# Patient Record
Sex: Male | Born: 1965
Health system: Southern US, Community
[De-identification: ages and names within clinical notes are randomized; demographics above are authoritative.]

## PROBLEM LIST (undated history)

## (undated) MED FILL — Irinotecan HCl Inj 100 MG/5ML (20 MG/ML): INTRAVENOUS | Qty: 14 | Status: AC

## (undated) MED FILL — Dexamethasone Sodium Phosphate Inj 100 MG/10ML: INTRAMUSCULAR | Qty: 1 | Status: AC

---

## 2020-06-03 DIAGNOSIS — R569 Unspecified convulsions: Secondary | ICD-10-CM | POA: Insufficient documentation

## 2020-06-03 DIAGNOSIS — C711 Malignant neoplasm of frontal lobe: Secondary | ICD-10-CM | POA: Insufficient documentation

## 2020-06-07 DIAGNOSIS — R569 Unspecified convulsions: Secondary | ICD-10-CM

## 2020-06-15 DIAGNOSIS — C711 Malignant neoplasm of frontal lobe: Secondary | ICD-10-CM | POA: Insufficient documentation

## 2020-08-16 ENCOUNTER — Other Ambulatory Visit: Payer: Self-pay | Admitting: Oncology

## 2020-08-16 DIAGNOSIS — C711 Malignant neoplasm of frontal lobe: Secondary | ICD-10-CM

## 2020-08-17 ENCOUNTER — Telehealth: Payer: Self-pay | Admitting: Oncology

## 2020-08-17 ENCOUNTER — Encounter: Payer: Self-pay | Admitting: Oncology

## 2020-08-17 ENCOUNTER — Inpatient Hospital Stay: Payer: Self-pay | Attending: Oncology

## 2020-08-17 ENCOUNTER — Other Ambulatory Visit: Payer: Self-pay | Admitting: Hematology and Oncology

## 2020-08-17 ENCOUNTER — Inpatient Hospital Stay (INDEPENDENT_AMBULATORY_CARE_PROVIDER_SITE_OTHER): Payer: Medicaid Other | Admitting: Oncology

## 2020-08-17 DIAGNOSIS — C711 Malignant neoplasm of frontal lobe: Secondary | ICD-10-CM

## 2020-08-17 DIAGNOSIS — R2689 Other abnormalities of gait and mobility: Secondary | ICD-10-CM | POA: Diagnosis not present

## 2020-08-17 DIAGNOSIS — R569 Unspecified convulsions: Secondary | ICD-10-CM

## 2020-08-17 DIAGNOSIS — G4089 Other seizures: Secondary | ICD-10-CM

## 2020-08-17 DIAGNOSIS — Z803 Family history of malignant neoplasm of breast: Secondary | ICD-10-CM

## 2020-08-17 DIAGNOSIS — Z79899 Other long term (current) drug therapy: Secondary | ICD-10-CM

## 2020-08-17 DIAGNOSIS — R531 Weakness: Secondary | ICD-10-CM

## 2020-08-17 DIAGNOSIS — R000 Tachycardia, unspecified: Secondary | ICD-10-CM

## 2020-08-17 LAB — HEPATIC FUNCTION PANEL
ALT: 15 (ref 10–40)
AST: 18 (ref 14–40)
Alkaline Phosphatase: 66 (ref 25–125)
Bilirubin, Total: 0.4

## 2020-08-17 LAB — BASIC METABOLIC PANEL
BUN: 16 (ref 4–21)
CO2: 28 — AB (ref 13–22)
Chloride: 107 (ref 99–108)
Creatinine: 0.8 (ref 0.6–1.3)
Glucose: 84
Potassium: 4.4 (ref 3.4–5.3)
Sodium: 142 (ref 137–147)

## 2020-08-17 LAB — CBC AND DIFFERENTIAL
HCT: 46 (ref 41–53)
Hemoglobin: 15.6 (ref 13.5–17.5)
Neutrophils Absolute: 5.07
Platelets: 220 (ref 150–399)
WBC: 8.6

## 2020-08-17 LAB — COMPREHENSIVE METABOLIC PANEL
Albumin: 4.2 (ref 3.5–5.0)
Calcium: 9.8 (ref 8.7–10.7)

## 2020-08-17 LAB — CBC: RBC: 4.72 (ref 3.87–5.11)

## 2020-08-17 NOTE — Telephone Encounter (Signed)
Patient referred by Sande Brothers, PA-C for Frontal Gliobastoma.  Appt made for 08/17/2020 Labs 3:45 pm - Consult 4:15 pm  Patient has Records in [place]

## 2020-08-17 NOTE — Progress Notes (Signed)
[facility]  [address] [place],  [place]  [address] [phone]  Clinic Day:  08/17/2020  Referring physician:  Sande Brothers, PA-C  CHIEF COMPLAINT:  CC: Left frontal glioblastoma  Current Treatment:  Will plan for Temodar 5 days per month   HISTORY OF PRESENT ILLNESS:  Leonard [name] is a 55 y.o. male referred by Sande Brothers, PA-C for the evaluation and treatment of frontal glioblastoma.  This began back in late October when the patient began to experience acute onset intractable right lower extremity motor partial seizures for approximately 2 weeks.  MRI brain from November revealed an enhancing mass along the paramedian posterior left frontal lobe measuring 1.5 x 1.1 x 1.3 cm and appeared to correlate to the mass seen on the comparison CT head from October.  An additional region of blood products and enhancement just posterior to this mass was also seen, measuring 1.2 x 0.9 x 0.9 cm.  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  While hospitalized at [facility] in [place] he started radiation with concurrent Temodar on December 2nd.  He was tapered off the dexamethasone during that time.  He completed 15 fractions of radiation therapy on December 21st and was released on December 23rd.   INTERVAL HISTORY:  Leonard [name] is currently on Vimpat, Depakote and Keppra for his seizures.  He was unable to fill the Klonopin.  He thinks he may be scheduled with his surgeon again on January 27th, but did not know the name or any details.  He still has weakness of the right lower extremity and uses a walker to ambulate.  Blood counts and chemistries are unremarkable.  His  appetite is good, and he is eating well.  He feels he may have lost some weight.  He denies fever, chills or other signs of  infection.  He denies nausea, vomiting, bowel issues, or abdominal pain.  He denies sore throat, cough, dyspnea, or chest pain.  REVIEW OF SYSTEMS:  Review of Systems  Constitutional: Negative.   HENT:  Negative.   Eyes: Negative.   Respiratory: Negative.   Cardiovascular: Negative.  Negative for leg swelling.  Gastrointestinal: Negative.   Endocrine: Negative.   Genitourinary: Negative.    Musculoskeletal: Positive for gait problem (uses a walker to ambulate).  Skin: Negative.   Neurological: Positive for extremity weakness (right), gait problem (uses a walker to ambulate) and seizures (partial, of the lower right extremity, on medication). Negative for dizziness, headaches and light-headedness.  Hematological: Negative.   Psychiatric/Behavioral: Negative.   All other systems reviewed and are negative.    VITALS:  Blood pressure (!) 150/97, pulse (!) 105, temperature 97.9 F (36.6 C), temperature source Oral, resp. rate 18, height 6\' 4"  (1.93 m), weight 158 lb 3.2 oz (71.8 kg), SpO2 97 %.  Wt Readings from Last 3 Encounters:  08/17/20 158 lb 3.2 oz (71.8 kg)    Body mass index is 19.26 kg/m.  Performance status (ECOG): 1 - Symptomatic but completely ambulatory  PHYSICAL EXAM:  Physical Exam Constitutional:      General: He is not in acute distress.    Appearance: Normal appearance. He is normal weight.  HENT:     Head: Normocephalic and atraumatic.  Eyes:     General: No scleral icterus.    Extraocular Movements: Extraocular movements intact.  Conjunctiva/sclera: Conjunctivae normal.     Pupils: Pupils are equal, round, and reactive to light.  Cardiovascular:     Rate and Rhythm: Regular rhythm. Tachycardia present.     Pulses: Normal pulses.     Heart sounds: Normal heart sounds. No murmur heard. No friction rub. No gallop.   Pulmonary:     Effort: Pulmonary effort is normal. No respiratory distress.     Breath sounds: Normal breath sounds.  Abdominal:      General: Bowel sounds are normal. There is no distension.     Palpations: Abdomen is soft. There is no mass.     Tenderness: There is no abdominal tenderness.  Musculoskeletal:        General: Normal range of motion.     Cervical back: Normal range of motion and neck supple.     Right lower leg: No edema.     Left lower leg: No edema.  Lymphadenopathy:     Cervical: No cervical adenopathy.  Skin:    General: Skin is warm and dry.     Comments: He has a scar about 6-7 cm on the vertex of his scalp.  He has a raised birthmark in the left posterior scalp.  Neurological:     General: No focal deficit present.     Mental Status: He is alert and oriented to person, place, and time. Mental status is at baseline.     Cranial Nerves: Cranial nerves are intact.     Motor: Weakness (right lower extremity, 1/5 strength) present.  Psychiatric:        Mood and Affect: Mood normal.        Behavior: Behavior normal.        Thought Content: Thought content normal.        Judgment: Judgment normal.     LABS:   CBC Latest Ref Rng & Units 08/17/2020  WBC - 8.6  Hemoglobin 13.5 - 17.5 15.6  Hematocrit 41 - 53 46  Platelets 150 - 399 220   CMP Latest Ref Rng & Units 08/17/2020  BUN 4 - 21 16  Creatinine 0.6 - 1.3 0.8  Sodium 137 - 147 142  Potassium 3.4 - 5.3 4.4  Chloride 99 - 108 107  CO2 13 - 22 28(A)  Calcium 8.7 - 10.7 9.8  Alkaline Phos 25 - 125 66  AST 14 - 40 18  ALT 10 - 40 15    STUDIES:  No results found.    HISTORY:  No past medical history on file. Seizures  Family History  Problem Relation Age of Onset  . Breast cancer Sister     Social History:  reports that he has never smoked. He has never used smokeless tobacco. He reports that he does not drink alcohol and does not use drugs.The patient is alone today.  He is single and lives at home with 2 of his brothers at this time.  He worked at [employer].    Allergies: No Known Allergies  Current Medications: Current  Outpatient Medications  Medication Sig Dispense Refill  . divalproex (DEPAKOTE) 500 MG DR tablet Take by mouth.    . levOCARNitine (CARNITOR) 330 MG tablet Take by mouth.    . levETIRAcetam (KEPPRA) 750 MG tablet Take 1,500 mg by mouth 2 (two) times daily.    Marland Kitchen VIMPAT 200 MG TABS tablet Take 200 mg by mouth 2 (two) times daily.     No current facility-administered medications for this visit.  ASSESSMENT & PLAN:   Assessment:  1.  Grade 4 left frontal glioblastoma.  We know he had a resection but I need the details on whether it was completely removed and whether any other device was used inter-operatively.  He started radiation with concurrent temozolomide on December 2nd, and completed 15 fractions of radiation therapy on December 21st.  We usually continue radiation for a longer period of time and I will need to discuss this with our radiation oncologist and tumor board.  I recommend that he continue Temodar for 5 days each month.  2.  Persistent right lower extremity weakness.  He has been using a walker to ambulate and working with physical therapy.  3.  Right lower extremity motor partial seizures.  He is currently on Vimpat, Depakote and Keppra.    Plan: This is a pleasant 55 year old male diagnosed with grade 4 glioblastoma of the left frontal lobe.  He underwent craniotomy on November 9th and again on November 11th due to residual tumor.  I am unsure if there was still residual disease after the second surgery, and we do not have the full records from [place] and we will obtain those records for full review.  While hospitalized he was placed on radiation with concurrent Temodar, and he completed 15 fractions for a total of 4,005 cGy on December 21st.  We need to review whether further radiation is needed.  We reviewed his diagnosis and I explained that we usually continue Temodar for 5 days per month.  This will decrease his chance for recurrence.  I will plan to contact Evelena Peat, our  social worker, as he is uninsured to discuss patient assistance.  He will continue to work with physical therapy for the weakness of the right lower extremity.  If he is unable to undergo MRI imaging at [facility], we can schedule that here.  He understands and agrees with this plan of care.  I have answered his questions and he knows to call with any concerns.     Thank you for the opportunity to participate in the care of your patients   I provided 60 minutes of face-to-face time during this this encounter and > 50% was spent counseling as documented under my assessment and plan.    [name], MD [facility] AT [facility] [address] [place] [address] Dept: [phone] Dept Fax: [phone]   I, Rita Ohara, am acting as scribe for [name], MD  I have reviewed this report as typed by the medical scribe, and it is complete and accurate.

## 2020-08-18 NOTE — Progress Notes (Signed)
Patient does not have any insurance, tried calling him to discuss. He did not answer, will try back at a later date.

## 2020-09-04 NOTE — Progress Notes (Signed)
Patient was recently Inpatient at [facility]. I got a note from Dr. Hinton Rao regarding patient. He was having a hard time affording his Seizure medications. I spoke with the Education officer, museum upstairs. She said that the doctor took him off his most expensive seizure medication and increased his other two. He was discharged on Friday,  09/01/2020. He told the hospital Social Worker that his brother could pay for the medications that he is needing. I tried to reach out to patient today and got his voicemail, asked that he call me back. Patient also told us we could call his brother, I tried him and reach his voicemail and could not leave a message. I will try him back this week.

## 2020-09-05 NOTE — Progress Notes (Deleted)
Open by mistake

## 2020-09-11 NOTE — Progress Notes (Signed)
[facility]  [address] [place],  [place]  [address] [phone]  Clinic Day:  09/13/2020  Referring physician:  Sande Brothers, PA-C  This document serves as a record of services personally performed by Hosie Poisson, MD. It was created on their behalf by Curry,Lauren E, a trained medical scribe. The creation of this record is based on the scribe's personal observations and the provider's statements to them.  CHIEF COMPLAINT:  CC: Left frontal glioblastoma  Current Treatment:  Will plan for Temodar 5 days per month   HISTORY OF PRESENT ILLNESS:  Leonard [name] is a 55 y.o. male referred by Sande Brothers, PA-C for the evaluation and treatment of frontal glioblastoma.  This began back in late October when the patient began to experience acute onset intractable right lower extremity motor partial seizures for approximately 2 weeks.  MRI brain from November revealed an enhancing mass along the paramedian posterior left frontal lobe measuring 1.5 x 1.1 x 1.3 cm and appeared to correlate to the mass seen on the comparison CT head from October.  An additional region of blood products and enhancement just posterior to this mass was also seen, measuring 1.2 x 0.9 x 0.9 cm.  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  While hospitalized at [facility] in [place] he started radiation with concurrent Temodar on December 2nd.  He was tapered off the dexamethasone during that time.  He completed 15 fractions of radiation therapy on December 21st and was released on December 23rd.   INTERVAL HISTORY:  Noriel is here for routine follow up as recent head MRI from January 26th revealed an enhancing 2.9 cm posteromedial left frontal mass, concerning for progression of disease.  He was admitted to the  hospital on January 25th due to recurrent seizure.  The patient states that he ran out of his Keppra and Vimpat 2 days prior due to lack of funds.  About 9:00 a.m. he noticed uncontrollable tension and shaking in his right arm.  He resumed oral antiepileptics in the hospital and was discharged on Depakote 750 mg BID and Keppra 1500 mg BID.  He is no longer on Vimpat due to the excessive cost.  He does also take the Levocarnitine.  They had also prescribed klonopin "for nerves or seizures", but he never had that filled.  He states that he has not had recurrent seizure since that time.  His  appetite is good, and he has gained 7 pounds since his last visit.  He denies fever, chills or other signs of infection.  He denies nausea, vomiting, bowel issues, or abdominal pain.  He denies sore throat, cough, dyspnea, or chest pain.  He is accompanied by his brother today.  REVIEW OF SYSTEMS:  Review of Systems  Constitutional: Negative.   HENT:  Negative.   Eyes: Negative.   Respiratory: Negative.   Cardiovascular: Negative.   Gastrointestinal: Negative.   Endocrine: Negative.   Genitourinary: Negative.    Musculoskeletal: Positive for gait problem (uses a walker to ambulate).  Skin: Negative.   Neurological: Positive for extremity weakness (right upper and lower), gait problem (uses a walker to ambulate) and seizures (controlled with his current antiepiliptic regimen).  Hematological: Negative.   Psychiatric/Behavioral: Negative.   All other systems reviewed and are negative.    VITALS:  Blood pressure (!) 137/93, pulse Marland Kitchen)  102, temperature 98 F (36.7 C), temperature source Oral, resp. rate 18, height 6\' 4"  (1.93 m), weight 165 lb 4.8 oz (75 kg).  Wt Readings from Last 3 Encounters:  09/13/20 165 lb 4.8 oz (75 kg)  08/17/20 158 lb 3.2 oz (71.8 kg)    Body mass index is 20.12 kg/m.  Performance status (ECOG): 1 - Symptomatic but completely ambulatory  PHYSICAL EXAM:  Physical  Exam Constitutional:      General: He is not in acute distress.    Appearance: Normal appearance. He is normal weight.  HENT:     Head: Normocephalic and atraumatic.  Eyes:     General: No scleral icterus.    Extraocular Movements: Extraocular movements intact.     Conjunctiva/sclera: Conjunctivae normal.     Pupils: Pupils are equal, round, and reactive to light.  Cardiovascular:     Rate and Rhythm: Normal rate and regular rhythm.     Pulses: Normal pulses.     Heart sounds: Normal heart sounds. No murmur heard. No friction rub. No gallop.   Pulmonary:     Effort: Pulmonary effort is normal. No respiratory distress.     Breath sounds: Normal breath sounds.  Abdominal:     General: Bowel sounds are normal. There is no distension.     Palpations: Abdomen is soft. There is no mass.     Tenderness: There is no abdominal tenderness.  Musculoskeletal:        General: Normal range of motion.     Cervical back: Normal range of motion and neck supple.     Right lower leg: No edema.     Left lower leg: No edema.  Lymphadenopathy:     Cervical: No cervical adenopathy.  Skin:    General: Skin is warm and dry.  Neurological:     General: No focal deficit present.     Mental Status: He is alert and oriented to person, place, and time. Mental status is at baseline.     Cranial Nerves: Cranial nerves are intact. No cranial nerve deficit.     Sensory: Sensation is intact. No sensory deficit.     Motor: Weakness (right upper and lower extremity) present.     Gait: Gait abnormal (right sided weakness).     Comments: Mental status is at baseline, but he has limited insight about his problems.  Strength is a 4/5 of the right upper and a 3/5 of the lower extremity.  Judgement may be limited.  Psychiatric:        Mood and Affect: Mood normal.        Behavior: Behavior normal.        Thought Content: Thought content normal.      LABS:   CBC Latest Ref Rng & Units 08/17/2020  WBC - 8.6   Hemoglobin 13.5 - 17.5 15.6  Hematocrit 41 - 53 46  Platelets 150 - 399 220   CMP Latest Ref Rng & Units 08/17/2020  BUN 4 - 21 16  Creatinine 0.6 - 1.3 0.8  Sodium 137 - 147 142  Potassium 3.4 - 5.3 4.4  Chloride 99 - 108 107  CO2 13 - 22 28(A)  Calcium 8.7 - 10.7 9.8  Alkaline Phos 25 - 125 66  AST 14 - 40 18  ALT 10 - 40 15    STUDIES:   He underwent CT head without contrast on 08/29/2020 showing: 1.  Interval high LEFT parietal craniotomy with large area of white matter hypoattenuation  at the LEFT frontal lobe likely related to surgery. 2.  Several tiny high attenuation foci are seen at the surgical site, question tiny calcifications. 3.  No definite acute intracranial abnormalities are identified. 4.  It is difficult to completely exclude residual tumor by noncontrast exam; this could be better assessed by MRI.  He underwent MRI head with and without contrast on 08/30/2020 showing: 1.  Enhancing 2.9 cm posteromedial left frontal mass, concerning for progression of disease. 2.  No acute intracranial process.   HISTORY:   Allergies: No Known Allergies  Current Medications: Current Outpatient Medications  Medication Sig Dispense Refill  . divalproex (DEPAKOTE) 250 MG DR tablet Take 3 tablets (750 mg total) by mouth 2 (two) times daily. 180 tablet 5  . levETIRAcetam (KEPPRA) 750 MG tablet Take 1,500 mg by mouth 2 (two) times daily.    Marland Kitchen levOCARNitine (CARNITOR) 330 MG tablet Take 2 tablets (660 mg total) by mouth 2 (two) times daily. 120 tablet 5  . VIMPAT 200 MG TABS tablet Take 200 mg by mouth 2 (two) times daily.     No current facility-administered medications for this visit.     ASSESSMENT & PLAN:   Assessment:  1.  Grade 4 left frontal glioblastoma.  We know he had a two surgeries in November, the first to attempt biopsy and the second for resection.  He started radiation with concurrent temozolomide on December 2nd, and completed 15 fractions of radiation  therapy on December 21st.  He now has rapid recurrence with a 2.9 cm lesion in the left posterior frontal lobe.  I have explained to him and his brother the gravity of the situation and the rapid nature of this lethal disease.  We need to at least revisit possible surgery and/or stereotactic radiosurgery, as well as further oral chemotherapy, at the very least.  I explained that time is of the essence and they are in agreement with referral to my partner Dr. Cecil Cobbs.    2.  Persistent right lower extremity weakness.  He has been using a walker to ambulate and working with physical therapy.  3.  Right lower extremity motor partial seizures.  He is currently on Depakote and Keppra.  Vimpat has been discontinued due to cost.    Plan: This is a pleasant 55 year old male diagnosed with grade 4 glioblastoma of the left frontal lobe.  He underwent craniotomy on November 9th and again on November 11th due to residual tumor.  While hospitalized he was placed on radiation with concurrent Temodar, and he completed 15 fractions for a total of 4,005 cGy on December 21st.  Unfortunately, recent MRI head from January has revealed an enlarging 2.9 cm posteromedial left frontal mass, which is concerning as this has enlarged in a short amount of time.  I do recommend that he see a neurosurgeon in consultation to discuss whether he would benefit from resection.  He really needs a multidisciplinary approach, and possibly stereotactic radiation is indicated.  He would benefit from chemotherapy with Temodar 5 days per month.  For his disease, he will require a multidisciplinary team.  We reviewed his diagnosis again, and that if nothing is done quickly this will threaten his life.  He is in agreement with pursing further treatment and consultation at [facility] in [place], so we will refer him to Dr. Mickeal Skinner.  I will refill his prescriptions today at [facility].  Evelena Peat the social worker came and spoke with him and  his brother  today.  He has still not been approved for disability or Medicade and has no job or insurance.  He and his brother understand and agree with this plan of care.  I have answered his questions and he knows to call with any concerns.      I provided 30 minutes of face-to-face time during this this encounter and > 50% was spent counseling as documented under my assessment and plan.    [name], MD [facility] AT [facility] [address] [place] [address] Dept: [phone] Dept Fax: [phone]   I, Rita Ohara, am acting as scribe for [name], MD  I have reviewed this report as typed by the medical scribe, and it is complete and accurate.

## 2020-09-13 ENCOUNTER — Encounter: Payer: Self-pay | Admitting: Oncology

## 2020-09-13 ENCOUNTER — Other Ambulatory Visit: Payer: Self-pay

## 2020-09-13 ENCOUNTER — Telehealth: Payer: Self-pay

## 2020-09-13 ENCOUNTER — Inpatient Hospital Stay: Payer: Medicaid Other | Attending: Oncology | Admitting: Oncology

## 2020-09-13 ENCOUNTER — Other Ambulatory Visit: Payer: Self-pay | Admitting: Oncology

## 2020-09-13 VITALS — BP 137/93 | HR 102 | Temp 98.0°F | Resp 18 | Ht 76.0 in | Wt 165.3 lb

## 2020-09-13 DIAGNOSIS — R531 Weakness: Secondary | ICD-10-CM | POA: Insufficient documentation

## 2020-09-13 DIAGNOSIS — Z803 Family history of malignant neoplasm of breast: Secondary | ICD-10-CM | POA: Insufficient documentation

## 2020-09-13 DIAGNOSIS — C711 Malignant neoplasm of frontal lobe: Secondary | ICD-10-CM | POA: Diagnosis not present

## 2020-09-13 DIAGNOSIS — Z79899 Other long term (current) drug therapy: Secondary | ICD-10-CM | POA: Insufficient documentation

## 2020-09-13 DIAGNOSIS — R569 Unspecified convulsions: Secondary | ICD-10-CM

## 2020-09-13 DIAGNOSIS — R112 Nausea with vomiting, unspecified: Secondary | ICD-10-CM | POA: Insufficient documentation

## 2020-09-13 MED ORDER — DIVALPROEX SODIUM 250 MG PO DR TAB: 750.0000 mg | DELAYED_RELEASE_TABLET | Freq: Two times a day (BID) | ORAL | 5 refills | Status: DC

## 2020-09-13 MED ORDER — LEVOCARNITINE 330 MG PO TABS: 660.0000 mg | ORAL_TABLET | Freq: Two times a day (BID) | ORAL | 5 refills | Status: DC

## 2020-09-13 NOTE — Telephone Encounter (Signed)
-----   Message from [name], MD sent at 09/13/2020  4:00 PM EST ----- Regarding: referral Pls ref Dr. Cecil Cobbs ASAP.  I sent him some info on secure chat

## 2020-09-13 NOTE — Telephone Encounter (Signed)
Faxed all referral information to Dr. Renda Rolls office. Faxed demographics, medication list, and last office note with referral information to [phone].

## 2020-09-14 NOTE — Progress Notes (Signed)
Spoke with patient and his brother Jaamal Farooqui yesterday. Patient thought he had been approved for Disability and Medicaid. I called and spoke with Leigh Casaus at the [place] office in [place], [place]. Patient has not been approved for either yet, both applications are still pending. I let patient know, he is needing refills of his seizure medications. Helped with enrolling him into the [employer], called and let [facility] know we would be covering the two medications Dr. Hinton Rao sent in today.

## 2020-09-15 ENCOUNTER — Telehealth: Payer: Self-pay | Admitting: Internal Medicine

## 2020-09-15 NOTE — Telephone Encounter (Signed)
Received a new pt referral from Dr. Hinton Rao for glioblastoma multiforme. Mr. Leonard [name] has been cld and scheduled to see Dr. Mickeal Skinner on 2/18 at 12pm. Pt aware to arrive 15 minutes early.

## 2020-09-21 ENCOUNTER — Ambulatory Visit: Payer: Self-pay | Admitting: Oncology

## 2020-09-22 ENCOUNTER — Other Ambulatory Visit (HOSPITAL_COMMUNITY): Payer: Self-pay | Admitting: Internal Medicine

## 2020-09-22 ENCOUNTER — Inpatient Hospital Stay (HOSPITAL_BASED_OUTPATIENT_CLINIC_OR_DEPARTMENT_OTHER): Payer: Medicaid Other | Admitting: Internal Medicine

## 2020-09-22 ENCOUNTER — Telehealth: Payer: Self-pay | Admitting: Pharmacist

## 2020-09-22 ENCOUNTER — Other Ambulatory Visit: Payer: Self-pay

## 2020-09-22 VITALS — BP 120/80 | HR 91 | Temp 97.8°F | Resp 18 | Ht 76.0 in | Wt 166.8 lb

## 2020-09-22 DIAGNOSIS — R531 Weakness: Secondary | ICD-10-CM | POA: Diagnosis not present

## 2020-09-22 DIAGNOSIS — Z79899 Other long term (current) drug therapy: Secondary | ICD-10-CM

## 2020-09-22 DIAGNOSIS — C711 Malignant neoplasm of frontal lobe: Secondary | ICD-10-CM

## 2020-09-22 DIAGNOSIS — Z803 Family history of malignant neoplasm of breast: Secondary | ICD-10-CM

## 2020-09-22 DIAGNOSIS — R112 Nausea with vomiting, unspecified: Secondary | ICD-10-CM | POA: Diagnosis not present

## 2020-09-22 DIAGNOSIS — R569 Unspecified convulsions: Secondary | ICD-10-CM

## 2020-09-22 MED ORDER — TEMOZOLOMIDE 100 MG PO CAPS: 150.0000 mg/m2/d | ORAL_CAPSULE | Freq: Every day | ORAL | 0 refills | Status: DC

## 2020-09-22 MED ORDER — ONDANSETRON HCL 8 MG PO TABS: 8.0000 mg | ORAL_TABLET | Freq: Two times a day (BID) | ORAL | 1 refills | Status: DC | PRN

## 2020-09-22 MED ORDER — DEXAMETHASONE 4 MG PO TABS: 4.0000 mg | ORAL_TABLET | Freq: Every day | ORAL | 0 refills | Status: DC

## 2020-09-22 MED FILL — ONDANSETRON HCL 8 MG TABLET: 8 | 15 days supply | Qty: 30 | Fill #0

## 2020-09-22 NOTE — Progress Notes (Signed)
[place] at [place], [place] [address] [phone]   New Patient Evaluation  Date of Service: 09/22/20 Patient Name: Leonard [name] Patient MRN: [id number] Patient DOB: 09/13/1965 Provider: [name], MD  Identifying Statement:  Leonard [name] is a 54 y.o. male with left frontal glioblastoma who presents for initial consultation and evaluation.    Referring Provider: [name], MD [place],  [place] [id number]  Oncologic History: 06/15/20: Simon Rhein craniotomy, resection at [facility]; path demonstrates glioblastoma 07/25/20: Completes 3 weeks hypofractionated radiation with Temozolomide ([facility])  Biomarkers:  MGMT Unknown.  IDH 1/2 Unknown.  EGFR Unknown  TERT Unknown   History of Present Illness: The patient's records from the referring physician were obtained and reviewed and the patient interviewed to confirm this HPI.  Leonard [name] presents today on behalf of Dr. Hinton Rao due to recent progression of tumor.  He describes ongoing right leg weakness, requiring use of a walker; this has not apparently worsened significantly in recent weeks.  He also acknowledges some clumsiness and dysfunction affecting the right arm, although subtle.  Denies fatigue, memory issues.  No recent seizures, he has been compliant with Keppra and Depakote.  Vimpat was discontinued because of expense issues.  Otherwise denies new or progressive neurologic deficits.  Medications: Current Outpatient Medications on File Prior to Visit  Medication Sig Dispense Refill  . divalproex (DEPAKOTE) 250 MG DR tablet Take 3 tablets (750 mg total) by mouth 2 (two) times daily. 180 tablet 5  . levETIRAcetam (KEPPRA) 750 MG tablet Take 1,500 mg by mouth 2 (two) times daily.    Marland Kitchen levOCARNitine (CARNITOR) 330 MG tablet Take 2 tablets (660 mg total) by mouth 2 (two) times daily. 120 tablet 5  . VIMPAT 200 MG TABS tablet  Take 200 mg by mouth 2 (two) times daily.     No current facility-administered medications on file prior to visit.    Allergies: No Known Allergies Past Medical History: No past medical history on file. Past Surgical History:  Social History:  Social History   Socioeconomic History  . Marital status: Single    Spouse name: Not on file  . Number of children: Not on file  . Years of education: Not on file  . Highest education level: Not on file  Occupational History  . Not on file  Tobacco Use  . Smoking status: Never Smoker  . Smokeless tobacco: Never Used  Substance and Sexual Activity  . Alcohol use: Never  . Drug use: Never  . Sexual activity: Not Currently  Other Topics Concern  . Not on file  Social History Narrative  . Not on file   Social Determinants of Health   Financial Resource Strain: Not on file  Food Insecurity: Not on file  Transportation Needs: Not on file  Physical Activity: Not on file  Stress: Not on file  Social Connections: Not on file  Intimate Partner Violence: Not on file   Family History:  Family History  Problem Relation Age of Onset  . Breast cancer Sister     Review of Systems: Constitutional: Doesn't report fevers, chills or abnormal weight loss Eyes: Doesn't report blurriness of vision Ears, nose, mouth, throat, and face: Doesn't report sore throat Respiratory: Doesn't report cough, dyspnea or wheezes Cardiovascular: Doesn't report palpitation, chest discomfort  Gastrointestinal:  Doesn't report nausea, constipation, diarrhea GU: Doesn't report incontinence Skin: Doesn't report skin rashes Neurological: Per HPI Musculoskeletal:  Doesn't report joint pain Behavioral/Psych: Doesn't report anxiety  Physical Exam: Vitals:   09/22/20 1217  BP: 120/80  Pulse: 91  Resp: 18  Temp: 97.8 F (36.6 C)  SpO2: 97%   KPS: 70. General: Alert, cooperative, pleasant, in no acute distress Head: Normal EENT: No conjunctival injection or  scleral icterus.  Lungs: Resp effort normal Cardiac: Regular rate Abdomen: Non-distended abdomen Skin: No rashes cyanosis or petechiae. Extremities: No clubbing or edema  Neurologic Exam: Mental Status: Awake, alert, attentive to examiner. Oriented to self and environment. Language is fluent with intact comprehension.  Cranial Nerves: Visual acuity is grossly normal. Visual fields are full. Extra-ocular movements intact. No ptosis. Face is symmetric Motor: Tone and bulk are normal. Power is 4/5 in right leg, 4++/5 in right arm. Reflexes are symmetric, no pathologic reflexes present.  Sensory: Intact to light touch Gait: Hemiparetic   Labs: I have reviewed the data as listed    Component Value Date/Time   NA 142 08/17/2020 0000   K 4.4 08/17/2020 0000   CL 107 08/17/2020 0000   CO2 28 (A) 08/17/2020 0000   BUN 16 08/17/2020 0000   CREATININE 0.8 08/17/2020 0000   CALCIUM 9.8 08/17/2020 0000   ALBUMIN 4.2 08/17/2020 0000   AST 18 08/17/2020 0000   ALT 15 08/17/2020 0000   ALKPHOS 66 08/17/2020 0000   Lab Results  Component Value Date   WBC 8.6 08/17/2020   NEUTROABS 5.07 08/17/2020   HGB 15.6 08/17/2020   HCT 46 08/17/2020   PLT 220 08/17/2020    Imaging: Reviewed outside imaging from [place]; MRI demonstrates 3cm mass along mesial left precentral gyrus, previously was 1cm.   Assessment/Plan 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[place]) [C71.1]  We appreciate the opportunity to participate in the care of Leonard [name].  He presents today with clinical and radiographic syndrome consistent with progressive left frontal glioblastoma.  Based on review of MRI, it is unclear if these changes represent true organic progression or post-RT pseudoprogression.  Unfortunately we do not have his post-operative scan available for review at this time, but report in chart suggests gross total resection in November.    It is yet unclear as to why he only underwent 3 weeks of IMRT  with Temodar.  He has not yet dosed any adjuvant chemotherapy.  We recommended initiating treatment with Temozolomide 142m/m2, on for five days and off for twenty three days in twenty eight day cycles. The patient will have a complete blood count performed on days 21 and 28 of each cycle, and a comprehensive metabolic panel performed on day 28 of each cycle. Labs may need to be performed more often. Zofran will prescribed for home use for nausea/vomiting.   Informed consent was obtained verbally at bedside to proceed with oral chemotherapy.  Chemotherapy should be held for the following:  ANC less than 1,000  Platelets less than 100,000  LFT or creatinine greater than 2x ULN  If clinical concerns/contraindications develop  We also recommended trial of dexamethasone 49mdaily for motor symptoms.  Screening for potential clinical trials was performed and discussed using eligibility criteria for active protocols at [facility], as well as national database available on [email or web address]   The patient is not a candidate for a research protocol at this time due to no suitable study identified.   We spent twenty additional minutes teaching regarding the natural history, biology, and historical experience in the treatment of brain tumors. We then  discussed in detail the current recommendations for therapy focusing on the mode of administration, mechanism of action, anticipated toxicities, and quality of life issues associated with this plan. We also provided teaching sheets for the patient to take home as an additional resource.  We will give Leonard [name] a call in 1 week to assess response to decadron and touch base regarding initiation of chemotherapy.  All questions were answered. The patient knows to call the clinic with any problems, questions or concerns. No barriers to learning were detected.  The total time spent in the encounter was 60 minutes  and more than 50% was on counseling and review of test results   [name], MD Medical Director of Neuro-Oncology [facility] 09/22/20 2:36 PM

## 2020-09-22 NOTE — Progress Notes (Signed)
START ON PATHWAY REGIMEN - Neuro     A cycle is every 28 days:     Temozolomide      Temozolomide   **Always confirm dose/schedule in your pharmacy ordering system**  Patient Characteristics: Glioblastoma (Grade IV Glioma), Newly Diagnosed / Treatment Naive, Good Performance Status and/or Younger Patient, MGMT Promoter Unmethylated/Unknown Disease Classification: Glioma Disease Classification: Glioblastoma (Grade IV Glioma) Disease Status: Newly Diagnosed / Treatment Naive Performance Status: Good Performance Status and/or Younger Patient MGMT Promoter Methylation Status: Awaiting Test Results Intent of Therapy: Non-Curative / Palliative Intent, Discussed with Patient

## 2020-09-22 NOTE — Telephone Encounter (Signed)
Oral Oncology Pharmacist Encounter  Received new prescription for Temodar (temozolomide) for the adjuvant treatment of glioblastoma, planned duration 6-12 cycles.  Prescription dose and frequency assessed for appropriateness. Appropriate for therapy initiation.   Labs from 08/17/20 assessed, dose appropriate for initiation.  Current medication list in Epic reviewed, no relevant/significant DDIs with Temodar identified.  Evaluated chart and no patient barriers to medication adherence noted.   Noted MD documentation that patient agreed to proceed with treatment with Temodar on 09/22/20.  Prescription has been e-scribed to the [facility] for benefits analysis and approval.  Oral Oncology Clinic will continue to follow for insurance authorization, copayment issues, initial counseling and start date.  Leron Croak, PharmD, BCPS Hematology/Oncology Clinical Pharmacist [facility] [phone] 09/22/2020 3:30 PM

## 2020-09-23 MED FILL — TEMOZOLOMIDE 100 MG CAPS: 100 | 14 days supply | Qty: 14 | Fill #0

## 2020-09-25 MED FILL — TEMOZOLOMIDE 100 MG CAPS: 100 | 5 days supply | Qty: 15 | Fill #0

## 2020-09-25 NOTE — Telephone Encounter (Signed)
Oral Chemotherapy Pharmacist Encounter  I spoke with patient for overview of: Temodar (temozolomide) for the maintenance treatment of glioblastoma multiforme, planned duration 6-12 months of treatment.  Counseled on administration, dosing, side effects, monitoring, drug-food interactions, safe handling, storage, and disposal.  Patient will take Temodar 100mg  capsules, 3 capsules (300mg  total daily dose) by mouth once daily, may take at bedtime and on an empty stomach to decrease nausea and vomiting.  If 1st cycle is well tolerated, patient informed that Temodar dose may be increased to 200 mg/m2 daily for 5 days on, 23 days off, repeated every 28 days for subsequent cycles   Patient will take Temodar daily for 5 days on, 23 days off, and repeated.  Temodar start date: 09/26/20   Patient will take Zofran 8mg  tablet, 1 tablet by mouth 30-60 min prior to Temodar dose to help decrease N/V.   Adverse effects include but are not limited to: nausea, vomiting, anorexia, GI upset, rash, drug fever, and fatigue. Rare but serious adverse effects of pneumocystis pneumonia and secondary malignancy also discussed.  We discussed strategies to manage constipation if they occur secondary to ondansetron dosing.  PCP prophylaxis will not be initiated at this time, but may be added based on lymphocyte count in the future.  Reviewed importance of keeping a medication schedule and plan for any missed doses. No barriers to medication adherence identified.  Medication reconciliation performed and medication/allergy list updated.  This will ship from the [facility] on 09/25/20 to deliver to patient's home on 09/26/20.  Patient informed the pharmacy will reach out 5-7 days prior to needing next fill of Temodar to coordinate continued medication acquisition to prevent break in therapy.  All questions answered.  Mr. Fults voiced understanding and appreciation.   Medication education handout  placed in mail for patient. Patient knows to call the office with questions or concerns. Oral Chemotherapy Clinic phone number provided to patient.   Leron Croak, PharmD, BCPS Hematology/Oncology Clinical Pharmacist [facility] [phone] 09/25/2020 10:16 AM

## 2020-09-29 ENCOUNTER — Other Ambulatory Visit: Payer: Self-pay

## 2020-09-29 ENCOUNTER — Inpatient Hospital Stay (HOSPITAL_BASED_OUTPATIENT_CLINIC_OR_DEPARTMENT_OTHER): Payer: Medicaid Other | Admitting: Internal Medicine

## 2020-09-29 VITALS — BP 122/69 | HR 79 | Temp 97.8°F | Resp 18 | Ht 76.0 in | Wt 170.6 lb

## 2020-09-29 DIAGNOSIS — R569 Unspecified convulsions: Secondary | ICD-10-CM | POA: Diagnosis not present

## 2020-09-29 DIAGNOSIS — C711 Malignant neoplasm of frontal lobe: Secondary | ICD-10-CM | POA: Diagnosis not present

## 2020-09-29 MED ORDER — DEXAMETHASONE 2 MG PO TABS: 2.0000 mg | ORAL_TABLET | Freq: Every day | ORAL | 2 refills | Status: DC

## 2020-09-29 NOTE — Progress Notes (Signed)
[place] at [place], [place] [address] [phone]   Interval Evaluation  Date of Service: 09/29/20 Patient Name: Leonard [name] Patient MRN: [id number] Patient DOB: [birth year] Provider: [name], MD  Identifying Statement:  Leonard [name] is a 55 y.o. male with left frontal glioblastoma   Referring Provider: No referring provider defined for this encounter.  Oncologic History: 06/15/20: Undergoes craniotomy, resection at [facility]; path demonstrates glioblastoma 07/25/20: Completes 3 weeks hypofractionated radiation with Temozolomide ([facility])  Biomarkers:  MGMT Unknown.  IDH 1/2 Unknown.  EGFR Unknown  TERT Unknown   Interval History:  Leonard [name] presents today for follow up after recent initation of decadron, and also chemotherapy (Temodar).  He has dosed 3 nights of TMZ so far, without complication.  He describes clear improvement in motor function on the right side.  He is lifting the leg more easily, and has less difficulty brushing his teeth and tying his shoes.  No other new complaints.  H+P (09/22/20) Patient presents today on behalf of Dr. Hinton Rao due to recent progression of tumor.  He describes ongoing right leg weakness, requiring use of a walker; this has not apparently worsened significantly in recent weeks.  He also acknowledges some clumsiness and dysfunction affecting the right arm, although subtle.  Denies fatigue, memory issues.  No recent seizures, he has been compliant with Keppra and Depakote.  Vimpat was discontinued because of expense issues.  Otherwise denies new or progressive neurologic deficits.  Medications: Current Outpatient Medications on File Prior to Visit  Medication Sig Dispense Refill  . dexamethasone (DECADRON) 4 MG tablet Take 1 tablet (4 mg total) by mouth daily. 7 tablet 0  . divalproex (DEPAKOTE) 250 MG DR tablet Take 3 tablets (750 mg total) by mouth 2 (two)  times daily. 180 tablet 5  . levETIRAcetam (KEPPRA) 750 MG tablet Take 1,500 mg by mouth 2 (two) times daily.    Marland Kitchen levOCARNitine (CARNITOR) 330 MG tablet Take 2 tablets (660 mg total) by mouth 2 (two) times daily. 120 tablet 5  . ondansetron (ZOFRAN) 8 MG tablet Take 1 tablet (8 mg total) by mouth 2 (two) times daily as needed (nausea and vomiting). May take 30-60 minutes prior to Temodar administration if nausea/vomiting occurs. 30 tablet 1  . temozolomide (TEMODAR) 100 MG capsule Take 3 capsules (300 mg total) by mouth daily. May take on an empty stomach to decrease nausea & vomiting. 15 capsule 0  . VIMPAT 200 MG TABS tablet Take 200 mg by mouth 2 (two) times daily.     No current facility-administered medications on file prior to visit.    Allergies: No Known Allergies Past Medical History: No past medical history on file. Past Surgical History:  Social History:  Social History   Socioeconomic History  . Marital status: Single    Spouse name: Not on file  . Number of children: Not on file  . Years of education: Not on file  . Highest education level: Not on file  Occupational History  . Not on file  Tobacco Use  . Smoking status: Never Smoker  . Smokeless tobacco: Never Used  Substance and Sexual Activity  . Alcohol use: Never  . Drug use: Never  . Sexual activity: Not Currently  Other Topics Concern  . Not on file  Social History Narrative  . Not on file   Social Determinants of Health   Financial Resource Strain: Not on file  Food  Insecurity: Not on file  Transportation Needs: Not on file  Physical Activity: Not on file  Stress: Not on file  Social Connections: Not on file  Intimate Partner Violence: Not on file   Family History:  Family History  Problem Relation Age of Onset  . Breast cancer Sister     Review of Systems: Constitutional: Doesn't report fevers, chills or abnormal weight loss Eyes: Doesn't report blurriness of vision Ears, nose, mouth,  throat, and face: Doesn't report sore throat Respiratory: Doesn't report cough, dyspnea or wheezes Cardiovascular: Doesn't report palpitation, chest discomfort  Gastrointestinal:  Doesn't report nausea, constipation, diarrhea GU: Doesn't report incontinence Skin: Doesn't report skin rashes Neurological: Per HPI Musculoskeletal: Doesn't report joint pain Behavioral/Psych: Doesn't report anxiety  Physical Exam: Vitals:   09/29/20 1120  BP: 122/69  Pulse: 79  Resp: 18  Temp: 97.8 F (36.6 C)  SpO2: 95%   KPS: 70. General: Alert, cooperative, pleasant, in no acute distress Head: Normal EENT: No conjunctival injection or scleral icterus.  Lungs: Resp effort normal Cardiac: Regular rate Abdomen: Non-distended abdomen Skin: No rashes cyanosis or petechiae. Extremities: No clubbing or edema  Neurologic Exam: Mental Status: Awake, alert, attentive to examiner. Oriented to self and environment. Language is fluent with intact comprehension.  Cranial Nerves: Visual acuity is grossly normal. Visual fields are full. Extra-ocular movements intact. No ptosis. Face is symmetric Motor: Tone and bulk are normal. Power is 4/5 in right leg, 4++/5 in right arm. Reflexes are symmetric, no pathologic reflexes present.  Sensory: Intact to light touch Gait: Hemiparetic   Labs: I have reviewed the data as listed    Component Value Date/Time   NA 142 08/17/2020 0000   K 4.4 08/17/2020 0000   CL 107 08/17/2020 0000   CO2 28 (A) 08/17/2020 0000   BUN 16 08/17/2020 0000   CREATININE 0.8 08/17/2020 0000   CALCIUM 9.8 08/17/2020 0000   ALBUMIN 4.2 08/17/2020 0000   AST 18 08/17/2020 0000   ALT 15 08/17/2020 0000   ALKPHOS 66 08/17/2020 0000   Lab Results  Component Value Date   WBC 8.6 08/17/2020   NEUTROABS 5.07 08/17/2020   HGB 15.6 08/17/2020   HCT 46 08/17/2020   PLT 220 08/17/2020    Assessment/Plan 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[place]) [C71.1]  Leonard [name] is  clinically improved today after corticosteroid therapy.  We recommended continuing treatment with Temozolomide 179m/m2, on for five days and off for twenty three days in twenty eight day cycles. The patient will have a complete blood count performed on days 21 and 28 of each cycle, and a comprehensive metabolic panel performed on day 28 of each cycle. Labs may need to be performed more often. Zofran will prescribed for home use for nausea/vomiting.   Chemotherapy should be held for the following:  ANC less than 1,000  Platelets less than 100,000  LFT or creatinine greater than 2x ULN  If clinical concerns/contraindications develop  We recommended decreasing decadron to 232mdaily, if tolerated.  KeHarshaan [name] should return to clinic next month prior to cycle #2, with labs for evaluation.  All questions were answered. The patient knows to call the clinic with any problems, questions or concerns. No barriers to learning were detected.  The total time spent in the encounter was 30 minutes and more than 50% was on counseling and review of test results   ZaVentura SellersMD Medical Director of Neuro-Oncology [facility]2/25/22  11:35 AM

## 2020-10-24 ENCOUNTER — Ambulatory Visit: Payer: Self-pay | Admitting: Internal Medicine

## 2020-10-24 ENCOUNTER — Other Ambulatory Visit: Payer: Self-pay

## 2020-10-26 ENCOUNTER — Other Ambulatory Visit: Payer: Self-pay

## 2020-10-26 ENCOUNTER — Inpatient Hospital Stay (HOSPITAL_BASED_OUTPATIENT_CLINIC_OR_DEPARTMENT_OTHER): Payer: Medicaid Other | Admitting: Internal Medicine

## 2020-10-26 ENCOUNTER — Inpatient Hospital Stay: Payer: Medicaid Other | Attending: Oncology

## 2020-10-26 ENCOUNTER — Other Ambulatory Visit (HOSPITAL_COMMUNITY): Payer: Self-pay | Admitting: Internal Medicine

## 2020-10-26 VITALS — BP 130/81 | HR 90 | Temp 98.1°F | Resp 18 | Ht 76.0 in | Wt 174.5 lb

## 2020-10-26 DIAGNOSIS — R112 Nausea with vomiting, unspecified: Secondary | ICD-10-CM | POA: Insufficient documentation

## 2020-10-26 DIAGNOSIS — R531 Weakness: Secondary | ICD-10-CM | POA: Diagnosis not present

## 2020-10-26 DIAGNOSIS — R569 Unspecified convulsions: Secondary | ICD-10-CM | POA: Diagnosis not present

## 2020-10-26 DIAGNOSIS — Z79899 Other long term (current) drug therapy: Secondary | ICD-10-CM | POA: Insufficient documentation

## 2020-10-26 DIAGNOSIS — Z803 Family history of malignant neoplasm of breast: Secondary | ICD-10-CM | POA: Diagnosis not present

## 2020-10-26 DIAGNOSIS — C711 Malignant neoplasm of frontal lobe: Secondary | ICD-10-CM | POA: Diagnosis present

## 2020-10-26 DIAGNOSIS — Z7952 Long term (current) use of systemic steroids: Secondary | ICD-10-CM | POA: Insufficient documentation

## 2020-10-26 LAB — CMP (CANCER CENTER ONLY)
ALT: 40 U/L (ref 0–44)
AST: 18 U/L (ref 15–41)
Albumin: 3.8 g/dL (ref 3.5–5.0)
Alkaline Phosphatase: 47 U/L (ref 38–126)
Anion gap: 12 (ref 5–15)
BUN: 18 mg/dL (ref 6–20)
CO2: 25 mmol/L (ref 22–32)
Calcium: 9.6 mg/dL (ref 8.9–10.3)
Chloride: 107 mmol/L (ref 98–111)
Creatinine: 0.97 mg/dL (ref 0.61–1.24)
GFR, Estimated: >60 mL/min (ref >60)
Glucose, Bld: 78 mg/dL (ref 70–99)
Potassium: 3.9 mmol/L (ref 3.5–5.1)
Sodium: 144 mmol/L (ref 135–145)
Total Bilirubin: 0.3 mg/dL (ref 0.3–1.2)
Total Protein: 6.9 g/dL (ref 6.5–8.1)

## 2020-10-26 LAB — CBC WITH DIFFERENTIAL (CANCER CENTER ONLY)
Abs Immature Granulocytes: 0.05 10*3/uL (ref 0.00–0.07)
Basophils Absolute: 0.1 10*3/uL (ref 0.0–0.1)
Basophils Relative: 1 %
Eosinophils Absolute: 0.2 10*3/uL (ref 0.0–0.5)
Eosinophils Relative: 3 %
HCT: 45.7 % (ref 39.0–52.0)
Hemoglobin: 15.1 g/dL (ref 13.0–17.0)
Immature Granulocytes: 1 %
Lymphocytes Relative: 45 %
Lymphs Abs: 3.9 10*3/uL (ref 0.7–4.0)
MCH: 32.8 pg (ref 26.0–34.0)
MCHC: 33 g/dL (ref 30.0–36.0)
MCV: 99.1 fL (ref 80.0–100.0)
Monocytes Absolute: 0.7 10*3/uL (ref 0.1–1.0)
Monocytes Relative: 8 %
Neutro Abs: 3.6 10*3/uL (ref 1.7–7.7)
Neutrophils Relative %: 42 %
Platelet Count: 188 10*3/uL (ref 150–400)
RBC: 4.61 MIL/uL (ref 4.22–5.81)
RDW: 13.2 % (ref 11.5–15.5)
WBC Count: 8.5 10*3/uL (ref 4.0–10.5)
nRBC: 0 % (ref 0.0–0.2)

## 2020-10-26 MED ORDER — TEMOZOLOMIDE 100 MG PO CAPS: 200.0000 mg/m2/d | ORAL_CAPSULE | Freq: Every day | ORAL | 0 refills | Status: DC

## 2020-10-26 NOTE — Progress Notes (Signed)
[place] at [facility], [place] [address] [phone]   Interval Evaluation  Date of Service: 10/26/20 Patient Name: Leonard [name] Patient MRN: [id number] Patient DOB: 06-Oct-1965 Provider: [name], MD  Identifying Statement:  Leonard [name] is a 55 y.o. male with left frontal glioblastoma   Oncologic History: 06/15/20: Undergoes craniotomy, resection at [facility]; path demonstrates glioblastoma 07/25/20: Completes 3 weeks hypofractionated radiation with Temozolomide ([facility]) 09/29/20: Initiates therapy with 5-day Temozolomide  Biomarkers:  MGMT Unknown.  IDH 1/2 Unknown.  EGFR Unknown  TERT Unknown   Interval History:  Leonard [name] presents today for follow up after completing first cycle of 5-day Temodar.  He describes modest improvement in right leg weakness.  Currently requiring the walker at home only intermittently.  Tolerated chemo well, no complications or side effects.  Continues on decadron 66m daily, Keppra and Depakote as prior. No other new complaints.  H+P (09/22/20) Patient presents today on behalf of Dr. MHinton [name] to recent progression of tumor.  He describes ongoing right leg weakness, requiring use of a walker; this has not apparently worsened significantly in recent weeks.  He also acknowledges some clumsiness and dysfunction affecting the right arm, although subtle.  Denies fatigue, memory issues.  No recent seizures, he has been compliant with Keppra and Depakote.  Vimpat was discontinued because of expense issues.  Otherwise denies new or progressive neurologic deficits.  Medications: Current Outpatient Medications on File Prior to Visit  Medication Sig Dispense Refill  . dexamethasone (DECADRON) 2 MG tablet Take 1 tablet (2 mg total) by mouth daily. 30 tablet 2  . divalproex (DEPAKOTE) 250 MG DR tablet Take 3 tablets (750 mg total) by mouth 2 (two) times daily. 180 tablet 5  .  levETIRAcetam (KEPPRA) 750 MG tablet Take 1,500 mg by mouth 2 (two) times daily.    .Marland KitchenlevOCARNitine (CARNITOR) 330 MG tablet Take 2 tablets (660 mg total) by mouth 2 (two) times daily. 120 tablet 5  . ondansetron (ZOFRAN) 8 MG tablet Take 1 tablet (8 mg total) by mouth 2 (two) times daily as needed (nausea and vomiting). May take 30-60 minutes prior to Temodar administration if nausea/vomiting occurs. 30 tablet 1  . temozolomide (TEMODAR) 100 MG capsule Take 3 capsules (300 mg total) by mouth daily. May take on an empty stomach to decrease nausea & vomiting. 15 capsule 0  . VIMPAT 200 MG TABS tablet Take 200 mg by mouth 2 (two) times daily.     No current facility-administered medications on file prior to visit.    Allergies: No Known Allergies Past Medical History: No past medical history on file. Past Surgical History:  Social History:  Social History   Socioeconomic History  . Marital status: Single    Spouse name: Not on file  . Number of children: Not on file  . Years of education: Not on file  . Highest education level: Not on file  Occupational History  . Not on file  Tobacco Use  . Smoking status: Never Smoker  . Smokeless tobacco: Never Used  Substance and Sexual Activity  . Alcohol use: Never  . Drug use: Never  . Sexual activity: Not Currently  Other Topics Concern  . Not on file  Social History Narrative  . Not on file   Social Determinants of Health   Financial Resource Strain: Not on file  Food Insecurity: Not on file  Transportation Needs: Not on file  Physical  Activity: Not on file  Stress: Not on file  Social Connections: Not on file  Intimate Partner Violence: Not on file   Family History:  Family History  Problem Relation Age of Onset  . Breast cancer Sister     Review of Systems: Constitutional: Doesn't report fevers, chills or abnormal weight loss Eyes: Doesn't report blurriness of vision Ears, nose, mouth, throat, and face: Doesn't report  sore throat Respiratory: Doesn't report cough, dyspnea or wheezes Cardiovascular: Doesn't report palpitation, chest discomfort  Gastrointestinal:  Doesn't report nausea, constipation, diarrhea GU: Doesn't report incontinence Skin: Doesn't report skin rashes Neurological: Per HPI Musculoskeletal: Doesn't report joint pain Behavioral/Psych: Doesn't report anxiety  Physical Exam: Vitals:   10/26/20 0949  BP: 130/81  Pulse: 90  Resp: 18  Temp: 98.1 F (36.7 C)  SpO2: 98%   KPS: 70. General: Alert, cooperative, pleasant, in no acute distress Head: Normal EENT: No conjunctival injection or scleral icterus.  Lungs: Resp effort normal Cardiac: Regular rate Abdomen: Non-distended abdomen Skin: No rashes cyanosis or petechiae. Extremities: No clubbing or edema  Neurologic Exam: Mental Status: Awake, alert, attentive to examiner. Oriented to self and environment. Language is fluent with intact comprehension.  Cranial Nerves: Visual acuity is grossly normal. Visual fields are full. Extra-ocular movements intact. No ptosis. Face is symmetric Motor: Tone and bulk are normal. Power is 4/5 in right leg, 4++/5 in right arm. Reflexes are symmetric, no pathologic reflexes present.  Sensory: Intact to light touch Gait: Hemiparetic   Labs: I have reviewed the data as listed    Component Value Date/Time   NA 142 08/17/2020 0000   K 4.4 08/17/2020 0000   CL 107 08/17/2020 0000   CO2 28 (A) 08/17/2020 0000   BUN 16 08/17/2020 0000   CREATININE 0.8 08/17/2020 0000   CALCIUM 9.8 08/17/2020 0000   ALBUMIN 4.2 08/17/2020 0000   AST 18 08/17/2020 0000   ALT 15 08/17/2020 0000   ALKPHOS 66 08/17/2020 0000   Lab Results  Component Value Date   WBC 8.5 10/26/2020   NEUTROABS 3.6 10/26/2020   HGB 15.1 10/26/2020   HCT 45.7 10/26/2020   MCV 99.1 10/26/2020   PLT 188 10/26/2020    Assessment/Plan 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[place]) [C71.1]  Leonard [name] is clinically  stable today, now having completed 1 cycle of adjuvant Temozolomide.  We recommended continuing treatment with cycle #2 Temozolomide, dose increased to 249m/m2, on for five days and off for twenty three days in twenty eight day cycles. The patient will have a complete blood count performed on days 21 and 28 of each cycle, and a comprehensive metabolic panel performed on day 28 of each cycle. Labs may need to be performed more often. Zofran will prescribed for home use for nausea/vomiting.   Chemotherapy should be held for the following:  ANC less than 1,000  Platelets less than 100,000  LFT or creatinine greater than 2x ULN  If clinical concerns/contraindications develop  We recommended decreasing decadron to 175mdaily x7 days, then stopping therapy.  Should continue Keppra 150051mID and Depakote 750m17mD for now, can consider weaning Depakote if continues to be seizure free next month.  KerrYann Biehnl should return to clinic next month prior to cycle #3, with MRI brain for evaluation.  All questions were answered. The patient knows to call the clinic with any problems, questions or concerns. No barriers to learning were detected.  The total time spent in the encounter was 30  minutes and more than 50% was on counseling and review of test results   Leonard Sellers, MD Medical Director of Neuro-Oncology [facility] at [place] 10/26/20 9:57 AM

## 2020-10-30 MED FILL — TEMOZOLOMIDE 100 MG CAPS: 100 | 5 days supply | Qty: 20 | Fill #0

## 2020-11-03 ENCOUNTER — Other Ambulatory Visit (HOSPITAL_COMMUNITY): Payer: Self-pay

## 2020-11-13 ENCOUNTER — Other Ambulatory Visit: Payer: Self-pay

## 2020-11-13 DIAGNOSIS — C711 Malignant neoplasm of frontal lobe: Secondary | ICD-10-CM

## 2020-11-13 DIAGNOSIS — R569 Unspecified convulsions: Secondary | ICD-10-CM

## 2020-11-13 MED ORDER — LEVETIRACETAM 750 MG PO TABS: 1500.0000 mg | ORAL_TABLET | Freq: Two times a day (BID) | ORAL | 1 refills | Status: DC

## 2020-11-24 ENCOUNTER — Ambulatory Visit (HOSPITAL_COMMUNITY): Payer: Medicaid Other

## 2020-11-27 ENCOUNTER — Other Ambulatory Visit (HOSPITAL_COMMUNITY): Payer: Self-pay

## 2020-11-27 ENCOUNTER — Inpatient Hospital Stay: Payer: Medicaid Other | Admitting: Internal Medicine

## 2020-11-27 ENCOUNTER — Inpatient Hospital Stay: Payer: Medicaid Other

## 2020-11-30 ENCOUNTER — Telehealth: Payer: Self-pay | Admitting: Internal Medicine

## 2020-11-30 NOTE — Telephone Encounter (Signed)
Scheduled appt per 4/28 sch msg. Pt aware.  

## 2020-12-01 ENCOUNTER — Other Ambulatory Visit: Payer: Self-pay | Admitting: Radiation Therapy

## 2020-12-05 ENCOUNTER — Other Ambulatory Visit: Payer: Self-pay

## 2020-12-05 ENCOUNTER — Ambulatory Visit (HOSPITAL_COMMUNITY)
Admission: RE | Admit: 2020-12-05 | Discharge: 2020-12-05 | Disposition: A | Discharge status: Home / Self Care | Payer: Medicaid Other | Source: Ambulatory Visit | Attending: Internal Medicine | Admitting: Internal Medicine

## 2020-12-05 DIAGNOSIS — C711 Malignant neoplasm of frontal lobe: Secondary | ICD-10-CM

## 2020-12-05 IMAGING — MR MR HEAD WO/W CM
13 series · 48 of 48 positions shown · IV contrast (gadavist)
Comparison: [DATE]

CLINICAL DATA: Follow-up glioblastoma

EXAM:
MRI HEAD WITHOUT AND WITH CONTRAST
TECHNIQUE: Multiplanar, multiecho pulse sequences of the brain and surrounding
structures were obtained without and with intravenous contrast.
CONTRAST:  7mL GADAVIST GADOBUTROL 1 MMOL/ML IV SOLN

[Series 5: DWI · axial · 3.0mm · 1.36mm/px · z∈[-29,+112]mm · 7 of 100 slices shown (1 of 2)]
[im 1/100]
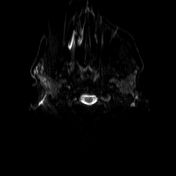
[im 17/100]
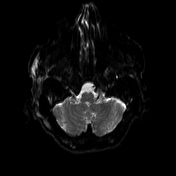
[im 34/100]
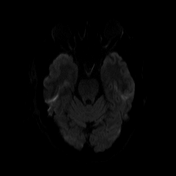
[im 50/100]
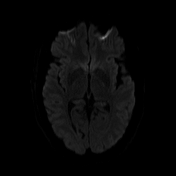
[im 67/100]
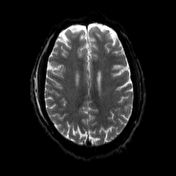
[im 83/100]
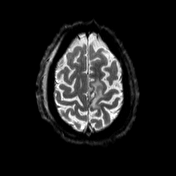
[im 100/100]
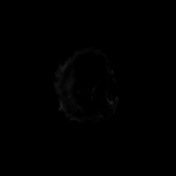

[Series 6: DWI · axial · 3.0mm · 1.36mm/px · z∈[-29,+112]mm · 3 of 50 slices shown (2 of 2)]
[im 1/50]
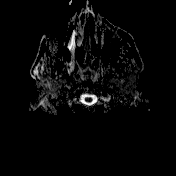
[im 25/50]
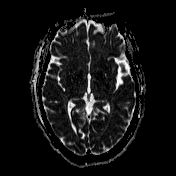
[im 50/50]
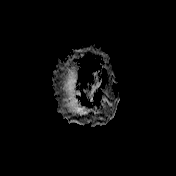

[Series 7: T1 · sagittal · 5.0mm · 0.75mm/px · 1 of 24 slices shown (1 of 2)]
[im 1/24]
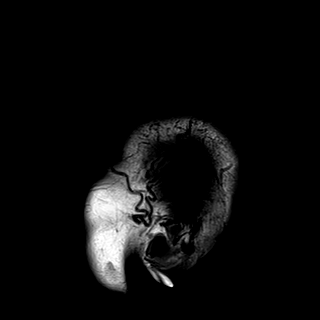

[Series 8: T2 · axial · 5.0mm · 0.62mm/px · z∈[-39,+117]mm · 2 of 26 slices shown]
[im 1/26]
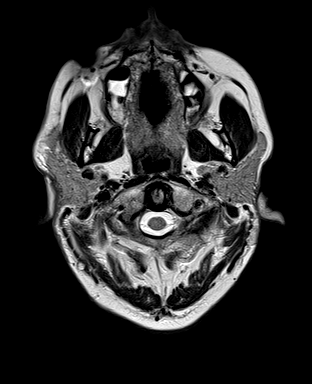
[im 26/26]
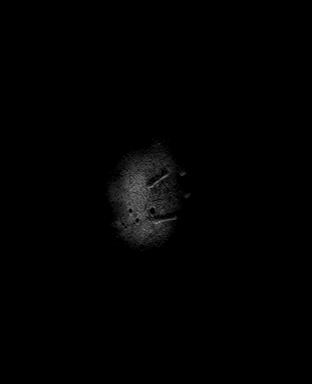

[Series 10: swi_images · axial · 3.0mm · 0.75mm/px · z∈[-40,+118]mm · 3 of 56 slices shown]
[im 1/56]
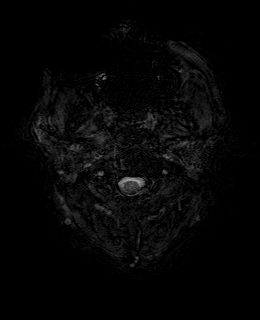
[im 28/56]
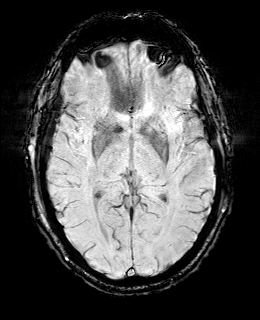
[im 56/56]
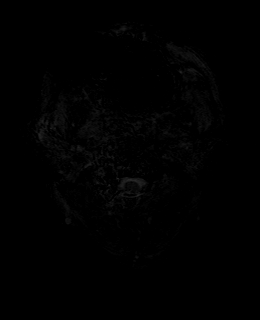

[Series 11: FLAIR · axial · 3.0mm · 0.75mm/px · z∈[-34,+112]mm · 3 of 52 slices shown]
[im 1/52]
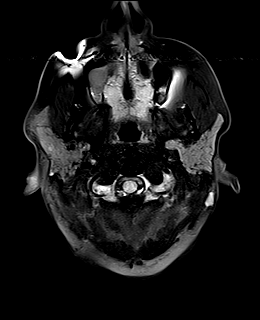
[im 26/52]
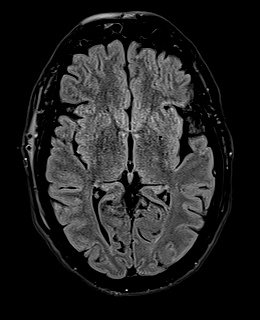
[im 52/52]
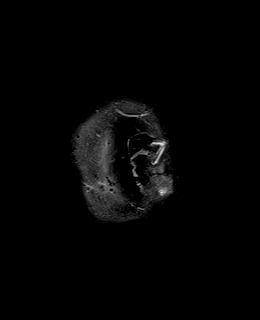

[Series 12: T1 · axial · 1.0mm · 0.94mm/px · z∈[-26,+111]mm · 9 of 144 slices shown (2 of 2)]
[im 1/144]
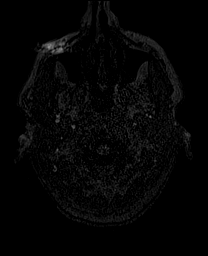
[im 18/144]
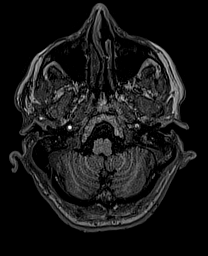
[im 36/144]
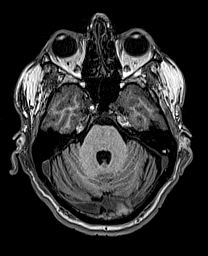
[im 54/144]
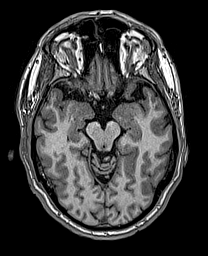
[im 72/144]
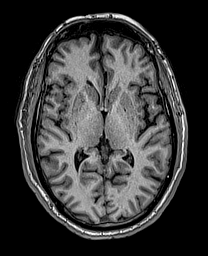
[im 90/144]
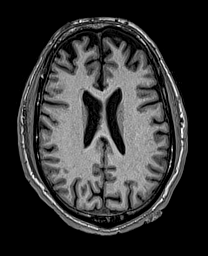
[im 108/144]
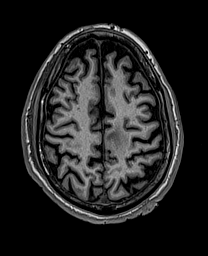
[im 126/144]
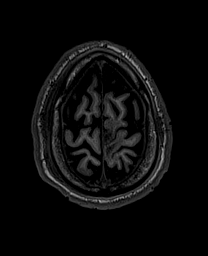
[im 144/144]
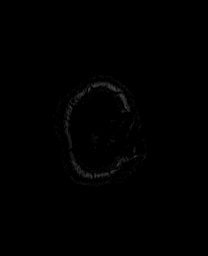

[Series 13: cor dwi_tracew · coronal · 5.0mm · 1.53mm/px · 4 of 64 slices shown]
[im 1/64]
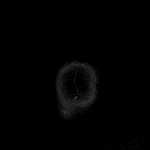
[im 22/64]
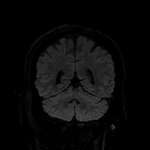
[im 43/64]
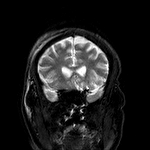
[im 64/64]
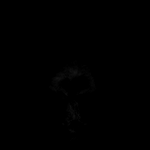

[Series 14: cor dwi_adc · coronal · 5.0mm · 1.53mm/px · 2 of 32 slices shown]
[im 1/32]
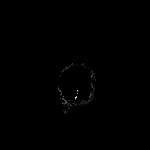
[im 32/32]
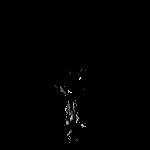

[Series 15: T2 post-contrast · coronal · 5.0mm · 0.57mm/px · 2 of 32 slices shown]
[im 1/32]
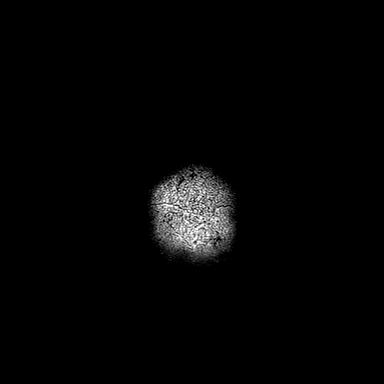
[im 32/32]
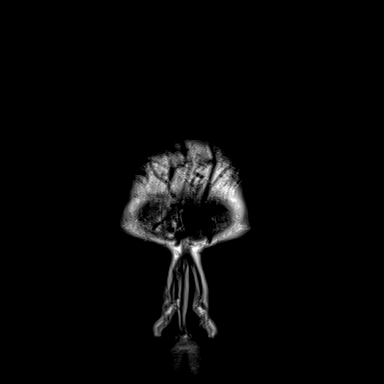

[Series 16: T1 post-contrast · axial · 1.0mm · 0.94mm/px · z∈[-26,+111]mm · 9 of 144 slices shown (1 of 3)]
[im 1/144]
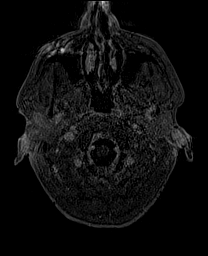
[im 18/144]
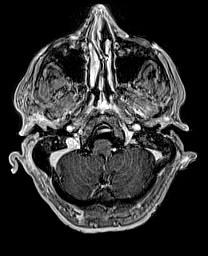
[im 36/144]
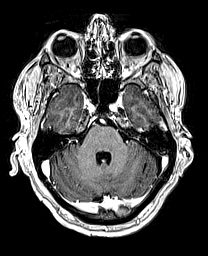
[im 54/144]
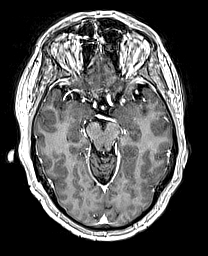
[im 72/144]
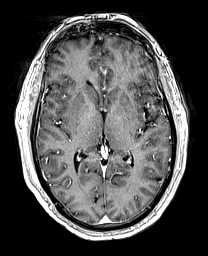
[im 90/144]
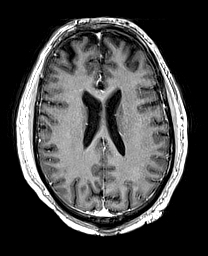
[im 108/144]
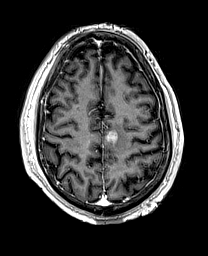
[im 126/144]
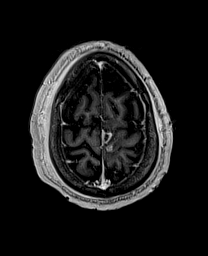
[im 144/144]
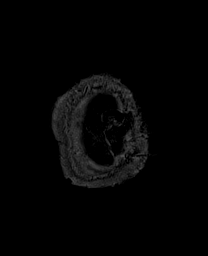

[Series 17: T1 post-contrast · coronal · 5.0mm · 0.43mm/px · 2 of 32 slices shown (2 of 3)]
[im 1/32]
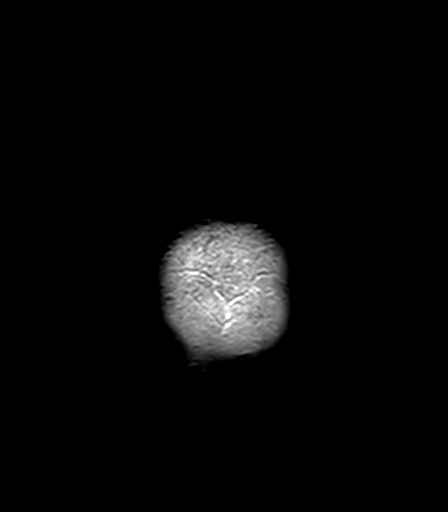
[im 32/32]
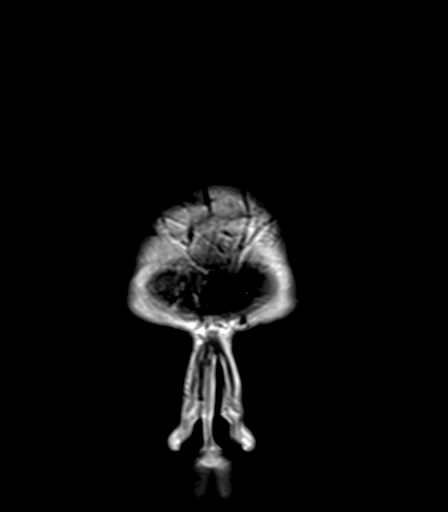

[Series 18: T1 post-contrast · sagittal · 5.0mm · 0.75mm/px · 1 of 24 slices shown (3 of 3)]
[im 1/24]
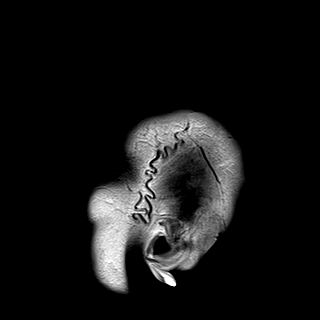

[48 of 48 positions shown; findings below may reference images not displayed]

FINDINGS: Brain: Left posterior and parasagittal frontalmass with stable
enhancing pattern but decreased enhancement volume, area measuring
24 x 16 x 26 mm as compared to 27 x 24 x 29 mm. There is also been a
substantial reduction in adjacent T2 signal and gyral thickening. No
evidence of subarachnoid spread. No new abnormality in the remaining
brain.

Vascular: Preserved flow voids and vascular enhancements

Skull and upper cervical spine: Normal marrow signal

Sinuses/Orbits: Chronic mild opacification of paranasal sinuses and
right mastoid.
IMPRESSION: Reduced enhancing volume, T2 signal, and mass effect. No interval
adverse finding.

## 2020-12-05 MED ORDER — GADOBUTROL 1 MMOL/ML IV SOLN
7.0000 mL | Freq: Once | INTRAVENOUS | Status: AC | PRN
  Administered 2020-12-05: 7 mL via INTRAVENOUS

## 2020-12-07 ENCOUNTER — Telehealth: Payer: Self-pay | Admitting: *Deleted

## 2020-12-11 ENCOUNTER — Inpatient Hospital Stay: Payer: Medicaid Other | Attending: Oncology

## 2020-12-11 DIAGNOSIS — C711 Malignant neoplasm of frontal lobe: Secondary | ICD-10-CM | POA: Insufficient documentation

## 2020-12-11 DIAGNOSIS — R531 Weakness: Secondary | ICD-10-CM | POA: Insufficient documentation

## 2020-12-11 DIAGNOSIS — Z803 Family history of malignant neoplasm of breast: Secondary | ICD-10-CM | POA: Insufficient documentation

## 2020-12-11 DIAGNOSIS — Z79899 Other long term (current) drug therapy: Secondary | ICD-10-CM | POA: Insufficient documentation

## 2020-12-11 DIAGNOSIS — R112 Nausea with vomiting, unspecified: Secondary | ICD-10-CM | POA: Insufficient documentation

## 2020-12-14 ENCOUNTER — Other Ambulatory Visit: Payer: Self-pay

## 2020-12-14 ENCOUNTER — Other Ambulatory Visit (HOSPITAL_COMMUNITY): Payer: Self-pay

## 2020-12-14 ENCOUNTER — Inpatient Hospital Stay (HOSPITAL_BASED_OUTPATIENT_CLINIC_OR_DEPARTMENT_OTHER): Payer: Medicaid Other | Admitting: Internal Medicine

## 2020-12-14 VITALS — BP 110/93 | HR 93 | Temp 97.8°F | Resp 19 | Ht 76.0 in | Wt 179.0 lb

## 2020-12-14 DIAGNOSIS — Z79899 Other long term (current) drug therapy: Secondary | ICD-10-CM | POA: Diagnosis not present

## 2020-12-14 DIAGNOSIS — R569 Unspecified convulsions: Secondary | ICD-10-CM

## 2020-12-14 DIAGNOSIS — C711 Malignant neoplasm of frontal lobe: Secondary | ICD-10-CM | POA: Diagnosis not present

## 2020-12-14 DIAGNOSIS — R531 Weakness: Secondary | ICD-10-CM | POA: Diagnosis not present

## 2020-12-14 DIAGNOSIS — Z803 Family history of malignant neoplasm of breast: Secondary | ICD-10-CM | POA: Diagnosis not present

## 2020-12-14 DIAGNOSIS — R112 Nausea with vomiting, unspecified: Secondary | ICD-10-CM | POA: Diagnosis not present

## 2020-12-14 MED ORDER — TEMOZOLOMIDE 100 MG PO CAPS
200.0000 mg/m2/d | ORAL_CAPSULE | Freq: Every day | ORAL | 0 refills | Status: DC
  Filled 2020-12-14 (×2): qty 20, 5d supply, fill #0

## 2020-12-14 NOTE — Progress Notes (Signed)
[place] at [place],  [address] [phone]   Interval Evaluation  Date of Service: 12/14/20 Patient Name: Leonard [name] Patient MRN: [id number] Patient DOB: [birth year] Provider: [name], MD  Identifying Statement:  Leonard [name] is a 55 y.o. male with left frontal glioblastoma   Oncologic History: 06/15/20: Undergoes craniotomy, resection at [facility]; path demonstrates glioblastoma 07/25/20: Completes 3 weeks hypofractionated radiation with Temozolomide ([facility]) 09/29/20: Initiates therapy with 5-day Temozolomide  Biomarkers:  MGMT Unknown.  IDH 1/2 Unknown.  EGFR Unknown  TERT Unknown   Interval History:  Leonard [name] presents today for follow up after completing second cycle of 5-day Temodar.  No issues with the higher dose level.  Right leg weakness might be "a little worse" since dropping the decadron down to 78m daily.  Still requiring the walker at home only intermittently.  Continues on Keppra and Depakote as prior. No other new complaints.  H+P (09/22/20) Patient presents today on behalf of Dr. MHinton [name] to recent progression of tumor.  He describes ongoing right leg weakness, requiring use of a walker; this has not apparently worsened significantly in recent weeks.  He also acknowledges some clumsiness and dysfunction affecting the right arm, although subtle.  Denies fatigue, memory issues.  No recent seizures, he has been compliant with Keppra and Depakote.  Vimpat was discontinued because of expense issues.  Otherwise denies new or progressive neurologic deficits.  Medications: Current Outpatient Medications on File Prior to Visit  Medication Sig Dispense Refill  . divalproex (DEPAKOTE) 250 MG DR tablet Take 3 tablets (750 mg total) by mouth 2 (two) times daily. 180 tablet 5  . levETIRAcetam (KEPPRA) 750 MG tablet Take 2 tablets (1,500 mg total) by mouth 2 (two) times daily. 120  tablet 1  . levOCARNitine (CARNITOR) 330 MG tablet Take 2 tablets (660 mg total) by mouth 2 (two) times daily. 120 tablet 5  . ondansetron (ZOFRAN) 8 MG tablet Take 1 tablet (8 mg total) by mouth 2 (two) times daily as needed (nausea and vomiting). May take 30-60 minutes prior to Temodar administration if nausea/vomiting occurs. 30 tablet 1  . ondansetron (ZOFRAN) 8 MG tablet TAKE 1 TABLET BY MOUTH 2 TIMES DAILY FOR NAUSEA OR VOMITING. MAY TAKE 30-60 MINUTES PRIOR TO TEMODAR IF NAUSEA OR VOMITING OCCURS 30 tablet 1  . temozolomide (TEMODAR) 100 MG capsule Take 4 capsules (400 mg total) by mouth daily. May take on an empty stomach to decrease nausea & vomiting. 20 capsule 0  . temozolomide (TEMODAR) 100 MG capsule TAKE 4 CAPSULES (400 MG TOTAL) BY MOUTH DAILY. MAY TAKE ON AN EMPTY STOMACH TO DECREASE NAUSEA & VOMITING. 20 capsule 0   No current facility-administered medications on file prior to visit.    Allergies: No Known Allergies Past Medical History: No past medical history on file. Past Surgical History:  Social History:  Social History   Socioeconomic History  . Marital status: Single    Spouse name: Not on file  . Number of children: Not on file  . Years of education: Not on file  . Highest education level: Not on file  Occupational History  . Not on file  Tobacco Use  . Smoking status: Never Smoker  . Smokeless tobacco: Never Used  Substance and Sexual Activity  . Alcohol use: Never  . Drug use: Never  . Sexual activity: Not Currently  Other Topics Concern  . Not on file  Social  History Narrative  . Not on file   Social Determinants of Health   Financial Resource Strain: Not on file  Food Insecurity: Not on file  Transportation Needs: Not on file  Physical Activity: Not on file  Stress: Not on file  Social Connections: Not on file  Intimate Partner Violence: Not on file   Family History:  Family History  Problem Relation Age of Onset  . Breast cancer Sister      Review of Systems: Constitutional: Doesn't report fevers, chills or abnormal weight loss Eyes: Doesn't report blurriness of vision Ears, nose, mouth, throat, and face: Doesn't report sore throat Respiratory: Doesn't report cough, dyspnea or wheezes Cardiovascular: Doesn't report palpitation, chest discomfort  Gastrointestinal:  Doesn't report nausea, constipation, diarrhea GU: Doesn't report incontinence Skin: Doesn't report skin rashes Neurological: Per HPI Musculoskeletal: Doesn't report joint pain Behavioral/Psych: Doesn't report anxiety  Physical Exam: Vitals:   12/14/20 1209  BP: (!) 110/93  Pulse: 93  Resp: 19  Temp: 97.8 F (36.6 C)  SpO2: 98%   KPS: 70. General: Alert, cooperative, pleasant, in no acute distress Head: Normal EENT: No conjunctival injection or scleral icterus.  Lungs: Resp effort normal Cardiac: Regular rate Abdomen: Non-distended abdomen Skin: No rashes cyanosis or petechiae. Extremities: No clubbing or edema  Neurologic Exam: Mental Status: Awake, alert, attentive to examiner. Oriented to self and environment. Language is fluent with intact comprehension.  Cranial Nerves: Visual acuity is grossly normal. Visual fields are full. Extra-ocular movements intact. No ptosis. Face is symmetric Motor: Tone and bulk are normal. Power is 4/5 in right leg, 4++/5 in right arm. Reflexes are symmetric, no pathologic reflexes present.  Sensory: Intact to light touch Gait: Hemiparetic   Labs: I have reviewed the data as listed    Component Value Date/Time   NA 144 10/26/2020 0931   NA 142 08/17/2020 0000   K 3.9 10/26/2020 0931   CL 107 10/26/2020 0931   CO2 25 10/26/2020 0931   GLUCOSE 78 10/26/2020 0931   BUN 18 10/26/2020 0931   BUN 16 08/17/2020 0000   CREATININE 0.97 10/26/2020 0931   CALCIUM 9.6 10/26/2020 0931   PROT 6.9 10/26/2020 0931   ALBUMIN 3.8 10/26/2020 0931   AST 18 10/26/2020 0931   ALT 40 10/26/2020 0931   ALKPHOS 47  10/26/2020 0931   BILITOT 0.3 10/26/2020 0931   GFRNONAA >60 10/26/2020 0931   Lab Results  Component Value Date   WBC 8.5 10/26/2020   NEUTROABS 3.6 10/26/2020   HGB 15.1 10/26/2020   HCT 45.7 10/26/2020   MCV 99.1 10/26/2020   PLT 188 10/26/2020   Imaging:  [place] Clinician Interpretation: I have personally reviewed the CNS images as listed.  My interpretation, in the context of the patient's clinical presentation, is stable disease  MR BRAIN W WO CONTRAST  Result Date: 12/07/2020 CLINICAL DATA:  Follow-up glioblastoma EXAM: MRI HEAD WITHOUT AND WITH CONTRAST TECHNIQUE: Multiplanar, multiecho pulse sequences of the brain and surrounding structures were obtained without and with intravenous contrast. CONTRAST:  87m GADAVIST GADOBUTROL 1 MMOL/ML IV SOLN COMPARISON:  08/30/2020 FINDINGS: Brain: Left posterior and parasagittal frontalmass with stable enhancing pattern but decreased enhancement volume, area measuring 24 x 16 x 26 mm as compared to 27 x 24 x 29 mm. There is also been a substantial reduction in adjacent T2 signal and gyral thickening. No evidence of subarachnoid spread. No new abnormality in the remaining brain. Vascular: Preserved flow voids and vascular enhancements Skull and upper cervical  spine: Normal marrow signal Sinuses/Orbits: Chronic mild opacification of paranasal sinuses and right mastoid. IMPRESSION: Reduced enhancing volume, T2 signal, and mass effect. No interval adverse finding. Electronically Signed   By: Monte Fantasia M.D.   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  MRI demonstrates contraction in enhancing volume on both axial and coronal post-contrast sequences.  We recommended continuing treatment with cycle #3 Temozolomide 261m/m2, on for five days and off for twenty three days in twenty eight day  cycles. The patient will have a complete blood count performed on days 21 and 28 of each cycle, and a comprehensive metabolic panel performed on day 28 of each cycle. Labs may need to be performed more often. Zofran will prescribed for home use for nausea/vomiting.   Chemotherapy should be held for the following:  ANC less than 1,000  Platelets less than 100,000  LFT or creatinine greater than 2x ULN  If clinical concerns/contraindications develop  Decadron can stay at 127mdaily, given subjective motor dysfunction with recently lowered dose.  Should continue Keppra 150053mID and Depakote 750m27mD.  KerrPrentis Langdonl should return to clinic next month prior to cycle #4, with labs for evaluation.  All questions were answered. The patient knows to call the clinic with any problems, questions or concerns. No barriers to learning were detected.  The total time spent in the encounter was 30 minutes and more than 50% was on counseling and review of test results   ZachVentura Sellers Medical Director of Neuro-Oncology [facility]12/22 12:07 PM

## 2020-12-15 ENCOUNTER — Other Ambulatory Visit (HOSPITAL_COMMUNITY): Payer: Self-pay

## 2021-01-03 ENCOUNTER — Other Ambulatory Visit: Payer: Self-pay | Admitting: Hematology and Oncology

## 2021-01-03 DIAGNOSIS — R569 Unspecified convulsions: Secondary | ICD-10-CM

## 2021-01-03 DIAGNOSIS — C711 Malignant neoplasm of frontal lobe: Secondary | ICD-10-CM

## 2021-01-04 ENCOUNTER — Encounter: Payer: Self-pay | Admitting: Internal Medicine

## 2021-01-04 NOTE — Telephone Encounter (Signed)
Dr. Mickeal Skinner, neuro-onc at [facility] is handling his care. Can this refill request be sent to him? Thanks

## 2021-01-08 ENCOUNTER — Other Ambulatory Visit (HOSPITAL_COMMUNITY): Payer: Self-pay

## 2021-01-10 ENCOUNTER — Other Ambulatory Visit (HOSPITAL_COMMUNITY): Payer: Self-pay

## 2021-01-11 ENCOUNTER — Inpatient Hospital Stay (HOSPITAL_BASED_OUTPATIENT_CLINIC_OR_DEPARTMENT_OTHER): Payer: Medicaid Other | Admitting: Internal Medicine

## 2021-01-11 ENCOUNTER — Other Ambulatory Visit (HOSPITAL_COMMUNITY): Payer: Self-pay

## 2021-01-11 ENCOUNTER — Other Ambulatory Visit: Payer: Self-pay

## 2021-01-11 ENCOUNTER — Inpatient Hospital Stay: Payer: Medicaid Other | Attending: Oncology

## 2021-01-11 VITALS — BP 127/79 | HR 79 | Temp 97.4°F | Resp 18 | Ht 76.0 in | Wt 183.0 lb

## 2021-01-11 DIAGNOSIS — Z79899 Other long term (current) drug therapy: Secondary | ICD-10-CM | POA: Insufficient documentation

## 2021-01-11 DIAGNOSIS — R531 Weakness: Secondary | ICD-10-CM | POA: Insufficient documentation

## 2021-01-11 DIAGNOSIS — Z803 Family history of malignant neoplasm of breast: Secondary | ICD-10-CM | POA: Diagnosis not present

## 2021-01-11 DIAGNOSIS — R569 Unspecified convulsions: Secondary | ICD-10-CM

## 2021-01-11 DIAGNOSIS — R112 Nausea with vomiting, unspecified: Secondary | ICD-10-CM | POA: Insufficient documentation

## 2021-01-11 DIAGNOSIS — C711 Malignant neoplasm of frontal lobe: Secondary | ICD-10-CM | POA: Diagnosis present

## 2021-01-11 LAB — CMP (CANCER CENTER ONLY)
ALT: 19 U/L (ref 0–44)
AST: 15 U/L (ref 15–41)
Albumin: 3.9 g/dL (ref 3.5–5.0)
Alkaline Phosphatase: 45 U/L (ref 38–126)
Anion gap: 10 (ref 5–15)
BUN: 11 mg/dL (ref 6–20)
CO2: 29 mmol/L (ref 22–32)
Calcium: 10.1 mg/dL (ref 8.9–10.3)
Chloride: 105 mmol/L (ref 98–111)
Creatinine: 0.87 mg/dL (ref 0.61–1.24)
GFR, Estimated: >60 mL/min (ref >60)
Glucose, Bld: 85 mg/dL (ref 70–99)
Potassium: 4.3 mmol/L (ref 3.5–5.1)
Sodium: 144 mmol/L (ref 135–145)
Total Bilirubin: 0.4 mg/dL (ref 0.3–1.2)
Total Protein: 7.1 g/dL (ref 6.5–8.1)

## 2021-01-11 LAB — CBC WITH DIFFERENTIAL (CANCER CENTER ONLY)
Abs Immature Granulocytes: 0.04 10*3/uL (ref 0.00–0.07)
Basophils Absolute: 0.1 10*3/uL (ref 0.0–0.1)
Basophils Relative: 1 %
Eosinophils Absolute: 0.3 10*3/uL (ref 0.0–0.5)
Eosinophils Relative: 4 %
HCT: 42.9 % (ref 39.0–52.0)
Hemoglobin: 14.6 g/dL (ref 13.0–17.0)
Immature Granulocytes: 1 %
Lymphocytes Relative: 44 %
Lymphs Abs: 2.9 10*3/uL (ref 0.7–4.0)
MCH: 33.1 pg (ref 26.0–34.0)
MCHC: 34 g/dL (ref 30.0–36.0)
MCV: 97.3 fL (ref 80.0–100.0)
Monocytes Absolute: 0.6 10*3/uL (ref 0.1–1.0)
Monocytes Relative: 8 %
Neutro Abs: 2.8 10*3/uL (ref 1.7–7.7)
Neutrophils Relative %: 42 %
Platelet Count: 196 10*3/uL (ref 150–400)
RBC: 4.41 MIL/uL (ref 4.22–5.81)
RDW: 14.1 % (ref 11.5–15.5)
WBC Count: 6.7 10*3/uL (ref 4.0–10.5)
nRBC: 0 % (ref 0.0–0.2)

## 2021-01-11 MED ORDER — TEMOZOLOMIDE 100 MG PO CAPS
200.0000 mg/m2/d | ORAL_CAPSULE | Freq: Every day | ORAL | 0 refills | Status: DC
  Filled 2021-01-11: qty 20, 5d supply, fill #0

## 2021-01-11 MED ORDER — ONDANSETRON HCL 8 MG PO TABS
8.0000 mg | ORAL_TABLET | Freq: Two times a day (BID) | ORAL | 1 refills | Status: DC | PRN
  Filled 2021-01-11: qty 30, 15d supply, fill #0
  Filled 2021-02-01: qty 30, 15d supply, fill #1

## 2021-01-11 NOTE — Progress Notes (Signed)
[place] at [place], [place] [address] [phone]   Interval Evaluation  Date of Service: 01/11/21 Patient Name: Leonard [name] Patient MRN: [id number] Patient DOB: 14-Oct-1965 Provider: [name], MD  Identifying Statement:  Leonard [name] is a 55 y.o. male with left frontal glioblastoma   Oncologic History: 06/15/20: Undergoes craniotomy, resection at [facility]; path demonstrates glioblastoma 07/25/20: Completes 3 weeks hypofractionated radiation with Temozolomide ([facility]) 09/29/20: Initiates therapy with 5-day Temozolomide  Biomarkers:  MGMT Unknown.  IDH 1/2 Unknown.  EGFR Unknown  TERT Unknown   Interval History:  Leonard [name] presents today for follow up after completing cycle #3 of 5-day Temodar.  No issues with the higher dose level.  Right sided weakness remains stable, no falls.  Still requiring the walker at home only intermittently.  Continues on Keppra and Depakote as prior. Decadron at 76m daily. No other new complaints.  H+P (09/22/20) Patient presents today on behalf of Dr. MHinton Raodue to recent progression of tumor.  He describes ongoing right leg weakness, requiring use of a walker; this has not apparently worsened significantly in recent weeks.  He also acknowledges some clumsiness and dysfunction affecting the right arm, although subtle.  Denies fatigue, memory issues.  No recent seizures, he has been compliant with Keppra and Depakote.  Vimpat was discontinued because of expense issues.  Otherwise denies new or progressive neurologic deficits.  Medications: Current Outpatient Medications on File Prior to Visit  Medication Sig Dispense Refill   divalproex (DEPAKOTE) 250 MG DR tablet Take 3 tablets (750 mg total) by mouth 2 (two) times daily. 180 tablet 5   levETIRAcetam (KEPPRA) 750 MG tablet 2 tabs by mouth twice daily 240 tablet 1   levOCARNitine (CARNITOR) 330 MG tablet Take 2 tablets  (660 mg total) by mouth 2 (two) times daily. 120 tablet 5   ondansetron (ZOFRAN) 8 MG tablet Take 1 tablet (8 mg total) by mouth 2 (two) times daily as needed (nausea and vomiting). May take 30-60 minutes prior to Temodar administration if nausea/vomiting occurs. 30 tablet 1   ondansetron (ZOFRAN) 8 MG tablet TAKE 1 TABLET BY MOUTH 2 TIMES DAILY FOR NAUSEA OR VOMITING. MAY TAKE 30-60 MINUTES PRIOR TO TEMODAR IF NAUSEA OR VOMITING OCCURS 30 tablet 1   temozolomide (TEMODAR) 100 MG capsule Take 4 capsules (400 mg total) by mouth daily. May take on an empty stomach to decrease nausea & vomiting. 20 capsule 0   temozolomide (TEMODAR) 100 MG capsule TAKE 4 CAPSULES (400 MG TOTAL) BY MOUTH DAILY. MAY TAKE ON AN EMPTY STOMACH TO DECREASE NAUSEA & VOMITING. 20 capsule 0   temozolomide (TEMODAR) 100 MG capsule Take 4 capsules (400 mg total) by mouth daily. May take on an empty stomach to decrease nausea & vomiting. 20 capsule 0   No current facility-administered medications on file prior to visit.    Allergies: No Known Allergies Past Medical History: No past medical history on file. Past Surgical History:  Social History:  Social History   Socioeconomic History   Marital status: Single    Spouse name: Not on file   Number of children: Not on file   Years of education: Not on file   Highest education level: Not on file  Occupational History   Not on file  Tobacco Use   Smoking status: Never   Smokeless tobacco: Never  Substance and Sexual Activity   Alcohol use: Never   Drug use: Never  Sexual activity: Not Currently  Other Topics Concern   Not on file  Social History Narrative   Not on file   Social Determinants of Health   Financial Resource Strain: Not on file  Food Insecurity: Not on file  Transportation Needs: Not on file  Physical Activity: Not on file  Stress: Not on file  Social Connections: Not on file  Intimate Partner Violence: Not on file   Family History:  Family  History  Problem Relation Age of Onset   Breast cancer Sister     Review of Systems: Constitutional: Doesn't report fevers, chills or abnormal weight loss Eyes: Doesn't report blurriness of vision Ears, nose, mouth, throat, and face: Doesn't report sore throat Respiratory: Doesn't report cough, dyspnea or wheezes Cardiovascular: Doesn't report palpitation, chest discomfort  Gastrointestinal:  Doesn't report nausea, constipation, diarrhea GU: Doesn't report incontinence Skin: Doesn't report skin rashes Neurological: Per HPI Musculoskeletal: Doesn't report joint pain Behavioral/Psych: Doesn't report anxiety  Physical Exam: Vitals:   01/11/21 0952  BP: 127/79  Pulse: 79  Resp: 18  Temp: (!) 97.4 F (36.3 C)  SpO2: 97%   KPS: 70. General: Alert, cooperative, pleasant, in no acute distress Head: Normal EENT: No conjunctival injection or scleral icterus.  Lungs: Resp effort normal Cardiac: Regular rate Abdomen: Non-distended abdomen Skin: No rashes cyanosis or petechiae. Extremities: No clubbing or edema  Neurologic Exam: Mental Status: Awake, alert, attentive to examiner. Oriented to self and environment. Language is fluent with intact comprehension.  Cranial Nerves: Visual acuity is grossly normal. Visual fields are full. Extra-ocular movements intact. No ptosis. Face is symmetric Motor: Tone and bulk are normal. Power is 4/5 in right leg, 4++/5 in right arm. Reflexes are symmetric, no pathologic reflexes present.  Sensory: Intact to light touch Gait: Hemiparetic   Labs: I have reviewed the data as listed    Component Value Date/Time   NA 144 01/11/2021 0927   NA 142 08/17/2020 0000   K 4.3 01/11/2021 0927   CL 105 01/11/2021 0927   CO2 29 01/11/2021 0927   GLUCOSE 85 01/11/2021 0927   BUN 11 01/11/2021 0927   BUN 16 08/17/2020 0000   CREATININE 0.87 01/11/2021 0927   CALCIUM 10.1 01/11/2021 0927   PROT 7.1 01/11/2021 0927   ALBUMIN 3.9 01/11/2021 0927   AST  15 01/11/2021 0927   ALT 19 01/11/2021 0927   ALKPHOS 45 01/11/2021 0927   BILITOT 0.4 01/11/2021 0927   GFRNONAA >60 01/11/2021 0927   Lab Results  Component Value Date   WBC 6.7 01/11/2021   NEUTROABS 2.8 01/11/2021   HGB 14.6 01/11/2021   HCT 42.9 01/11/2021   MCV 97.3 01/11/2021   PLT 196 01/11/2021     Assessment/Plan 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[place]) [C71.1]  Leonard [name] is clinically stable today, now having completed 3 cycles of adjuvant Temozolomide.  No new or progressive deficits.  We recommended continuing treatment with cycle #4 Temozolomide $RemoveBeforeD'200mg'ULQYOsXVqdpoJu$ /m2, on for five days and off for twenty three days in twenty eight day cycles. The patient will have a complete blood count performed on days 21 and 28 of each cycle, and a comprehensive metabolic panel performed on day 28 of each cycle. Labs may need to be performed more often. Zofran will prescribed for home use for nausea/vomiting.   Chemotherapy should be held for the following:  ANC less than 1,000  Platelets less than 100,000  LFT or creatinine greater than 2x ULN  If clinical concerns/contraindications develop  Decadron can stay at 60m daily.  Should continue Keppra 15063mBID and Depakote 75034mID.  Leonard Limbaughll should return to clinic next month prior to cycle #5, with labs for evaluation.  All questions were answered. The patient knows to call the clinic with any problems, questions or concerns. No barriers to learning were detected.  The total time spent in the encounter was 30 minutes and more than 50% was on counseling and review of test results   ZacVentura SellersD Medical Director of Neuro-Oncology [facility] [place]/09/22 10:10 AM

## 2021-01-12 ENCOUNTER — Other Ambulatory Visit (HOSPITAL_COMMUNITY): Payer: Self-pay

## 2021-01-16 ENCOUNTER — Encounter: Payer: Self-pay | Admitting: *Deleted

## 2021-01-16 NOTE — Progress Notes (Signed)
Leonard [name]  Clinical Social [name] was referred by Insurance underwriter for assessment of psychosocial needs.  Clinical Social Worker contacted patient by phone  to offer support and assess for needs.  CSW unable to reach patient, left VM.  CSW contacted patient's brother, Leonard [name]. [name] reported patient currently has Medicaid but received a letter he was due to lose coverage due to assets.  CSW encouraged patient's brother to instruct patient to call CSW back to determine next steps.   Gwinda Maine, LCSW  Clinical Social Worker [facility]

## 2021-01-19 ENCOUNTER — Encounter: Payer: Self-pay | Admitting: Internal Medicine

## 2021-01-24 ENCOUNTER — Other Ambulatory Visit: Payer: Self-pay | Admitting: *Deleted

## 2021-01-25 ENCOUNTER — Other Ambulatory Visit: Payer: Self-pay | Admitting: Hematology and Oncology

## 2021-01-26 ENCOUNTER — Telehealth: Payer: Self-pay

## 2021-01-26 NOTE — Telephone Encounter (Signed)
@  1622 - Pt returned my call. I told him that Dr Mickeal Skinner would need to give the refill for the Carnitor, as we haven't seen him since Feb. Pt will call Dr Mickeal Skinner on Monday to get refill, possibly from the [facility].    Pt hasn't been seen here since Feb 2022. Pt needs to contact Dr Mickeal Skinner for refill.

## 2021-01-29 ENCOUNTER — Other Ambulatory Visit: Payer: Self-pay | Admitting: Oncology

## 2021-01-29 ENCOUNTER — Other Ambulatory Visit (HOSPITAL_COMMUNITY): Payer: Self-pay

## 2021-01-29 ENCOUNTER — Other Ambulatory Visit: Payer: Self-pay | Admitting: Internal Medicine

## 2021-01-29 DIAGNOSIS — R569 Unspecified convulsions: Secondary | ICD-10-CM

## 2021-01-30 ENCOUNTER — Other Ambulatory Visit (HOSPITAL_COMMUNITY): Payer: Self-pay

## 2021-01-31 ENCOUNTER — Encounter: Payer: Self-pay | Admitting: *Deleted

## 2021-01-31 NOTE — Progress Notes (Signed)
Leonard [name]  Clinical Social [name] contacted patient by phone. Leonard [name] SSI and Medicaid was denied due to assets. Leonard [name] provided necessary documentation to [place] office. CSW contacted patient and local office by conference call- representative Leonard [name] corrected patient's information.  Patient's SSI and Medicaid is now reinstated.  Gwinda Maine, LCSW  Clinical Social Worker [facility]

## 2021-02-01 ENCOUNTER — Other Ambulatory Visit: Payer: Self-pay | Admitting: Hematology and Oncology

## 2021-02-01 ENCOUNTER — Other Ambulatory Visit (HOSPITAL_COMMUNITY): Payer: Self-pay

## 2021-02-01 ENCOUNTER — Other Ambulatory Visit: Payer: Self-pay | Admitting: Oncology

## 2021-02-01 DIAGNOSIS — R569 Unspecified convulsions: Secondary | ICD-10-CM

## 2021-02-01 DIAGNOSIS — C711 Malignant neoplasm of frontal lobe: Secondary | ICD-10-CM

## 2021-02-02 ENCOUNTER — Other Ambulatory Visit (HOSPITAL_COMMUNITY): Payer: Self-pay

## 2021-02-02 ENCOUNTER — Ambulatory Visit (HOSPITAL_COMMUNITY): Admission: RE | Admit: 2021-02-02 | Payer: Medicaid Other | Source: Ambulatory Visit

## 2021-02-07 ENCOUNTER — Encounter: Payer: Self-pay | Admitting: Internal Medicine

## 2021-02-07 ENCOUNTER — Other Ambulatory Visit (HOSPITAL_COMMUNITY): Payer: Self-pay

## 2021-02-08 ENCOUNTER — Other Ambulatory Visit: Payer: Self-pay

## 2021-02-08 ENCOUNTER — Inpatient Hospital Stay (HOSPITAL_BASED_OUTPATIENT_CLINIC_OR_DEPARTMENT_OTHER): Payer: Medicaid Other | Admitting: Internal Medicine

## 2021-02-08 ENCOUNTER — Other Ambulatory Visit (HOSPITAL_COMMUNITY): Payer: Self-pay

## 2021-02-08 ENCOUNTER — Inpatient Hospital Stay: Payer: Medicaid Other | Attending: Oncology

## 2021-02-08 VITALS — BP 122/83 | HR 74 | Temp 98.0°F | Resp 20 | Ht 76.0 in | Wt 180.3 lb

## 2021-02-08 DIAGNOSIS — Z803 Family history of malignant neoplasm of breast: Secondary | ICD-10-CM | POA: Diagnosis not present

## 2021-02-08 DIAGNOSIS — R531 Weakness: Secondary | ICD-10-CM | POA: Insufficient documentation

## 2021-02-08 DIAGNOSIS — C711 Malignant neoplasm of frontal lobe: Secondary | ICD-10-CM

## 2021-02-08 DIAGNOSIS — Z79899 Other long term (current) drug therapy: Secondary | ICD-10-CM | POA: Diagnosis not present

## 2021-02-08 DIAGNOSIS — R569 Unspecified convulsions: Secondary | ICD-10-CM | POA: Insufficient documentation

## 2021-02-08 DIAGNOSIS — R112 Nausea with vomiting, unspecified: Secondary | ICD-10-CM | POA: Insufficient documentation

## 2021-02-08 LAB — CMP (CANCER CENTER ONLY)
ALT: 10 U/L (ref 0–44)
AST: 10 U/L — ABNORMAL LOW (ref 15–41)
Albumin: 3.7 g/dL (ref 3.5–5.0)
Alkaline Phosphatase: 48 U/L (ref 38–126)
Anion gap: 10 (ref 5–15)
BUN: 7 mg/dL (ref 6–20)
CO2: 27 mmol/L (ref 22–32)
Calcium: 9.9 mg/dL (ref 8.9–10.3)
Chloride: 106 mmol/L (ref 98–111)
Creatinine: 0.82 mg/dL (ref 0.61–1.24)
GFR, Estimated: >60 mL/min (ref >60)
Glucose, Bld: 73 mg/dL (ref 70–99)
Potassium: 3.8 mmol/L (ref 3.5–5.1)
Sodium: 143 mmol/L (ref 135–145)
Total Bilirubin: 0.6 mg/dL (ref 0.3–1.2)
Total Protein: 7 g/dL (ref 6.5–8.1)

## 2021-02-08 LAB — CBC WITH DIFFERENTIAL (CANCER CENTER ONLY)
Abs Immature Granulocytes: 0.02 10*3/uL (ref 0.00–0.07)
Basophils Absolute: 0.1 10*3/uL (ref 0.0–0.1)
Basophils Relative: 1 %
Eosinophils Absolute: 0.1 10*3/uL (ref 0.0–0.5)
Eosinophils Relative: 2 %
HCT: 41.9 % (ref 39.0–52.0)
Hemoglobin: 14.2 g/dL (ref 13.0–17.0)
Immature Granulocytes: 0 %
Lymphocytes Relative: 47 %
Lymphs Abs: 3.1 10*3/uL (ref 0.7–4.0)
MCH: 33.3 pg (ref 26.0–34.0)
MCHC: 33.9 g/dL (ref 30.0–36.0)
MCV: 98.1 fL (ref 80.0–100.0)
Monocytes Absolute: 0.6 10*3/uL (ref 0.1–1.0)
Monocytes Relative: 9 %
Neutro Abs: 2.7 10*3/uL (ref 1.7–7.7)
Neutrophils Relative %: 41 %
Platelet Count: 201 10*3/uL (ref 150–400)
RBC: 4.27 MIL/uL (ref 4.22–5.81)
RDW: 14.1 % (ref 11.5–15.5)
WBC Count: 6.5 10*3/uL (ref 4.0–10.5)
nRBC: 0 % (ref 0.0–0.2)

## 2021-02-08 MED ORDER — DIVALPROEX SODIUM 500 MG PO DR TAB: 500.0000 mg | DELAYED_RELEASE_TABLET | Freq: Two times a day (BID) | ORAL | 3 refills | Status: DC

## 2021-02-08 NOTE — Progress Notes (Signed)
[place] at [place], [place] [address] [phone]   Interval Evaluation  Date of Service: 02/08/21 Patient Name: Leonard [name] Patient MRN: [id number] Patient DOB: 03-19-1966 Provider: [name], MD  Identifying Statement:  Leonard [name] is a 55 y.o. male with left frontal glioblastoma   Oncologic History: 06/15/20: Undergoes craniotomy, resection at [facility]; path demonstrates glioblastoma 07/25/20: Completes 3 weeks hypofractionated radiation with Temozolomide ([facility]) 09/29/20: Initiates therapy with 5-day Temozolomide  Biomarkers:  MGMT Unknown.  IDH 1/2 Unknown.  EGFR Unknown  TERT Unknown   Interval History:  Leonard [name] presents today for follow up after completing cycle #4 of 5-day Temodar.  No new or progressive complaints.  Right sided weakness remains stable, no falls.  Still requiring the walker at home only intermittently.  Continues on Keppra and Depakote as prior. Decadron at 39m daily. Did not show up for his MRI scheduled for 7/1.  H+P (09/22/20) Patient presents today on behalf of Dr. MHinton Raodue to recent progression of tumor.  He describes ongoing right leg weakness, requiring use of a walker; this has not apparently worsened significantly in recent weeks.  He also acknowledges some clumsiness and dysfunction affecting the right arm, although subtle.  Denies fatigue, memory issues.  No recent seizures, he has been compliant with Keppra and Depakote.  Vimpat was discontinued because of expense issues.  Otherwise denies new or progressive neurologic deficits.  Medications: Current Outpatient Medications on File Prior to Visit  Medication Sig Dispense Refill   divalproex (DEPAKOTE) 250 MG DR tablet Take 3 tablets (750 mg total) by mouth 2 (two) times daily. 180 tablet 5   levETIRAcetam (KEPPRA) 750 MG tablet 2 tabs by mouth twice daily 240 tablet 1   levOCARNitine (CARNITOR) 330 MG  tablet Take 2 tablets (660 mg total) by mouth 2 (two) times daily. 120 tablet 5   ondansetron (ZOFRAN) 8 MG tablet Take 1 tablet (8 mg total) by mouth 2 (two) times daily as needed (nausea and vomiting). May take 30-60 minutes prior to Temodar administration if nausea/vomiting occurs. 30 tablet 1   ondansetron (ZOFRAN) 8 MG tablet TAKE 1 TABLET BY MOUTH 2 TIMES DAILY FOR NAUSEA OR VOMITING. MAY TAKE 30-60 MINUTES PRIOR TO TEMODAR IF NAUSEA OR VOMITING OCCURS 30 tablet 1   ondansetron (ZOFRAN) 8 MG tablet Take 1 tablet by mouth 2 times daily as needed (nausea and vomiting). May take 30 - 60 minutes prior to Temodar administration if nausea/vomiting occurs. 30 tablet 1   temozolomide (TEMODAR) 100 MG capsule Take 4 capsules (400 mg total) by mouth daily. May take on an empty stomach to decrease nausea & vomiting. 20 capsule 0   temozolomide (TEMODAR) 100 MG capsule TAKE 4 CAPSULES (400 MG TOTAL) BY MOUTH DAILY. MAY TAKE ON AN EMPTY STOMACH TO DECREASE NAUSEA & VOMITING. 20 capsule 0   temozolomide (TEMODAR) 100 MG capsule Take 4 capsules (400 mg total) by mouth daily. May take on an empty stomach to decrease nausea & vomiting. 20 capsule 0   No current facility-administered medications on file prior to visit.    Allergies: No Known Allergies Past Medical History: No past medical history on file. Past Surgical History:  Social History:  Social History   Socioeconomic History   Marital status: Single    Spouse name: Not on file   Number of children: Not on file   Years of education: Not on file   Highest education  level: Not on file  Occupational History   Not on file  Tobacco Use   Smoking status: Never   Smokeless tobacco: Never  Substance and Sexual Activity   Alcohol use: Never   Drug use: Never   Sexual activity: Not Currently  Other Topics Concern   Not on file  Social History Narrative   Not on file   Social Determinants of Health   Financial Resource Strain: Not on file   Food Insecurity: Not on file  Transportation Needs: Not on file  Physical Activity: Not on file  Stress: Not on file  Social Connections: Not on file  Intimate Partner Violence: Not on file   Family History:  Family History  Problem Relation Age of Onset   Breast cancer Sister     Review of Systems: Constitutional: Doesn't report fevers, chills or abnormal weight loss Eyes: Doesn't report blurriness of vision Ears, nose, mouth, throat, and face: Doesn't report sore throat Respiratory: Doesn't report cough, dyspnea or wheezes Cardiovascular: Doesn't report palpitation, chest discomfort  Gastrointestinal:  Doesn't report nausea, constipation, diarrhea GU: Doesn't report incontinence Skin: Doesn't report skin rashes Neurological: Per HPI Musculoskeletal: Doesn't report joint pain Behavioral/Psych: Doesn't report anxiety  Physical Exam: Vitals:   02/08/21 0934  BP: 122/83  Pulse: 74  Resp: 20  Temp: 98 F (36.7 C)  SpO2: 97%    KPS: 70. General: Alert, cooperative, pleasant, in no acute distress Head: Normal EENT: No conjunctival injection or scleral icterus.  Lungs: Resp effort normal Cardiac: Regular rate Abdomen: Non-distended abdomen Skin: No rashes cyanosis or petechiae. Extremities: No clubbing or edema  Neurologic Exam: Mental Status: Awake, alert, attentive to examiner. Oriented to self and environment. Language is fluent with intact comprehension.  Cranial Nerves: Visual acuity is grossly normal. Visual fields are full. Extra-ocular movements intact. No ptosis. Face is symmetric Motor: Tone and bulk are normal. Power is 4/5 in right leg, 4++/5 in right arm. Reflexes are symmetric, no pathologic reflexes present.  Sensory: Intact to light touch Gait: Hemiparetic   Labs: I have reviewed the data as listed    Component Value Date/Time   NA 144 01/11/2021 0927   NA 142 08/17/2020 0000   K 4.3 01/11/2021 0927   CL 105 01/11/2021 0927   CO2 29  01/11/2021 0927   GLUCOSE 85 01/11/2021 0927   BUN 11 01/11/2021 0927   BUN 16 08/17/2020 0000   CREATININE 0.87 01/11/2021 0927   CALCIUM 10.1 01/11/2021 0927   PROT 7.1 01/11/2021 0927   ALBUMIN 3.9 01/11/2021 0927   AST 15 01/11/2021 0927   ALT 19 01/11/2021 0927   ALKPHOS 45 01/11/2021 0927   BILITOT 0.4 01/11/2021 0927   GFRNONAA >60 01/11/2021 0927   Lab Results  Component Value Date   WBC 6.5 02/08/2021   NEUTROABS 2.7 02/08/2021   HGB 14.2 02/08/2021   HCT 41.9 02/08/2021   MCV 98.1 02/08/2021   PLT 201 02/08/2021     Assessment/Plan 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[place]) [C71.1]  Leonard [name] is clinically stable today, now having completed 4 cycles of adjuvant Temozolomide.  He was unable to get to his MRI appointment this past week.  We recommended continuing treatment with cycle #5 Temozolomide 271m/m2, on for five days and off for twenty three days in twenty eight day cycles. The patient will have a complete blood count performed on days 21 and 28 of each cycle, and a comprehensive metabolic panel performed on day 28 of each  cycle. Labs may need to be performed more often. Zofran will prescribed for home use for nausea/vomiting.   Chemotherapy should be held for the following:  ANC less than 1,000  Platelets less than 100,000  LFT or creatinine greater than 2x ULN  If clinical concerns/contraindications develop  Decadron can stay at 14m daily.  Should continue Keppra 15050mBID.  Depakote can decrease to 50063mID due to prolonged seizure freedom.  Will obtain MRI scan next month prior to cycle 5.  KerLamichael [name] should return to clinic next month prior to cycle #5, with labs for evaluation.  All questions were answered. The patient knows to call the clinic with any problems, questions or concerns. No barriers to learning were detected.  The total time spent in the encounter was 30 minutes and more than 50% was on counseling and  review of test results   ZacVentura SellersD Medical Director of Neuro-Oncology [facility] [place]/07/22 9:33 AM

## 2021-02-12 ENCOUNTER — Other Ambulatory Visit: Payer: Self-pay | Admitting: Hematology and Oncology

## 2021-02-12 ENCOUNTER — Other Ambulatory Visit (HOSPITAL_COMMUNITY): Payer: Self-pay

## 2021-02-12 DIAGNOSIS — R569 Unspecified convulsions: Secondary | ICD-10-CM

## 2021-02-12 DIAGNOSIS — C711 Malignant neoplasm of frontal lobe: Secondary | ICD-10-CM

## 2021-02-12 MED ORDER — LEVETIRACETAM 750 MG PO TABS
1500.0000 mg | ORAL_TABLET | Freq: Two times a day (BID) | ORAL | 1 refills | Status: DC
  Filled 2021-02-12 – 2021-02-22 (×2): qty 120, 30d supply, fill #0
  Filled 2021-04-10: qty 120, 30d supply, fill #1

## 2021-02-13 ENCOUNTER — Other Ambulatory Visit (HOSPITAL_COMMUNITY): Payer: Self-pay

## 2021-02-20 ENCOUNTER — Encounter: Payer: Self-pay | Admitting: Internal Medicine

## 2021-02-22 ENCOUNTER — Other Ambulatory Visit (HOSPITAL_COMMUNITY): Payer: Self-pay

## 2021-03-06 ENCOUNTER — Encounter: Payer: Self-pay | Admitting: Internal Medicine

## 2021-03-06 ENCOUNTER — Other Ambulatory Visit: Payer: Self-pay | Admitting: Radiation Therapy

## 2021-03-13 ENCOUNTER — Other Ambulatory Visit: Payer: Self-pay

## 2021-03-13 ENCOUNTER — Ambulatory Visit (HOSPITAL_COMMUNITY)
Admission: RE | Admit: 2021-03-13 | Discharge: 2021-03-13 | Disposition: A | Discharge status: Home / Self Care | Payer: Medicaid Other | Source: Ambulatory Visit | Attending: Internal Medicine | Admitting: Internal Medicine

## 2021-03-13 ENCOUNTER — Other Ambulatory Visit: Payer: Medicaid Other

## 2021-03-13 DIAGNOSIS — C711 Malignant neoplasm of frontal lobe: Secondary | ICD-10-CM | POA: Insufficient documentation

## 2021-03-13 IMAGING — MR MR HEAD WO/W CM
14 series · 48 of 48 positions shown · IV contrast (gadavist)
Comparison: [DATE]

CLINICAL DATA: Follow-up of glioblastoma multiforme. Completion of
cycle 4 of Temodar.

Oncologic History:[DATE]: Undergoes craniotomy, resection at
[facility]; path demonstrates glioblastoma[DATE]: Completes 3 weeks
hypofractionated radiation with Temozolomide ([facility])[DATE]:
Initiates therapy with 5-day Temozolomide
EXAM:
MRI HEAD WITHOUT AND WITH CONTRAST
TECHNIQUE: Multiplanar, multiecho pulse sequences of the brain and surrounding
structures were obtained without and with intravenous contrast.
CONTRAST:  8mL GADAVIST GADOBUTROL 1 MMOL/ML IV SOLN

[Series 5: DWI · axial · 3.0mm · 1.36mm/px · z∈[-43,+118]mm · 7 of 112 slices shown (1 of 2)]
[im 1/112]
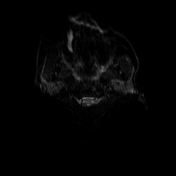
[im 19/112]
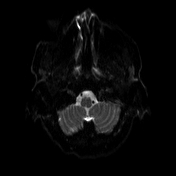
[im 38/112]
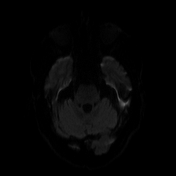
[im 56/112]
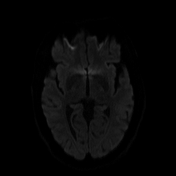
[im 75/112]
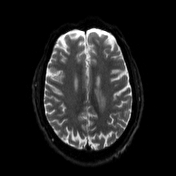
[im 93/112]
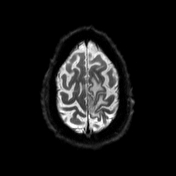
[im 112/112]
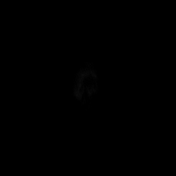

[Series 6: DWI · axial · 3.0mm · 1.36mm/px · z∈[-43,+118]mm · 4 of 56 slices shown (2 of 2)]
[im 1/56]
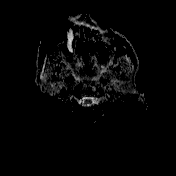
[im 19/56]
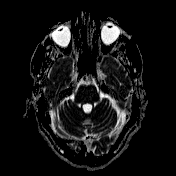
[im 37/56]
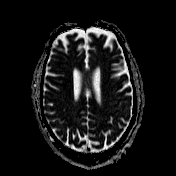
[im 56/56]
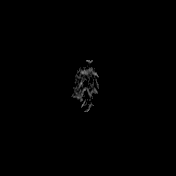

[Series 7: T1 · sagittal · 5.0mm · 0.75mm/px · 1 of 26 slices shown (1 of 2)]
[im 1/26]
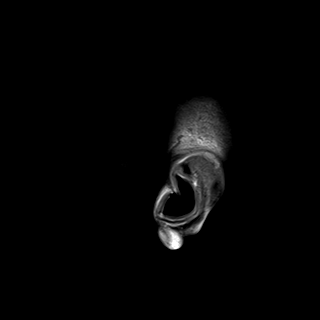

[Series 8: T2 · axial · 5.0mm · 0.62mm/px · 1 of 25 slices shown (1 of 2)]
[im 1/25]
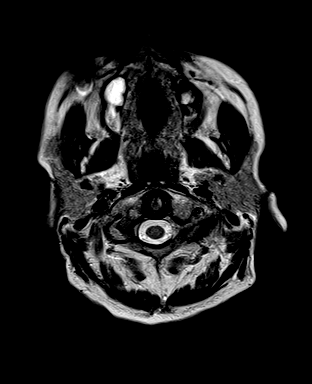

[Series 9: swi_images · axial · 3.0mm · 0.75mm/px · z∈[-43,+119]mm · 3 of 56 slices shown]
[im 1/56]
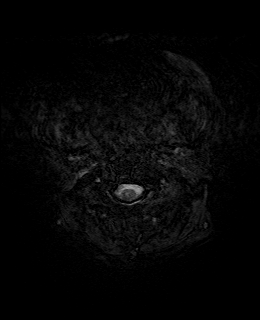
[im 28/56]
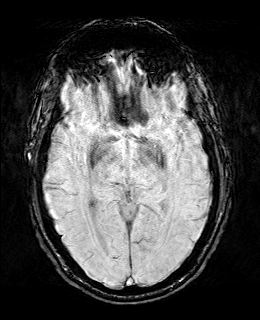
[im 56/56]
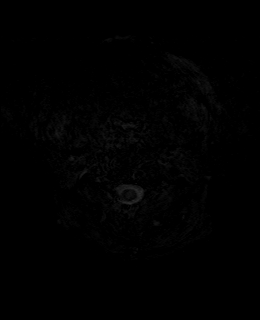

[Series 11: FLAIR · axial · 3.0mm · 0.75mm/px · z∈[-37,+113]mm · 3 of 52 slices shown]
[im 1/52]
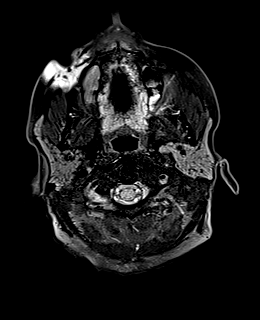
[im 26/52]
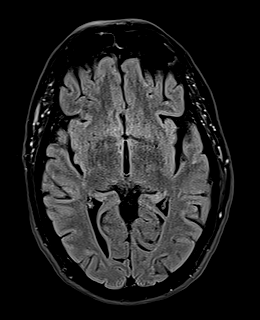
[im 52/52]
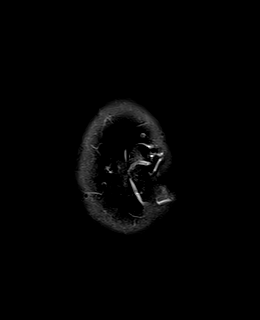

[Series 12: T1 · axial · 1.0mm · 0.94mm/px · z∈[-38,+117]mm · 9 of 160 slices shown (2 of 2)]
[im 1/160]
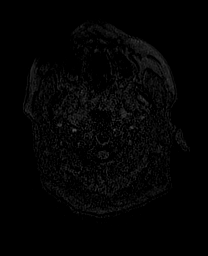
[im 20/160]
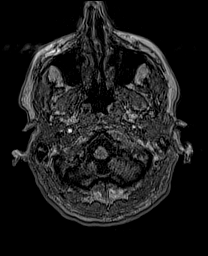
[im 40/160]
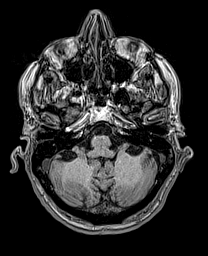
[im 60/160]
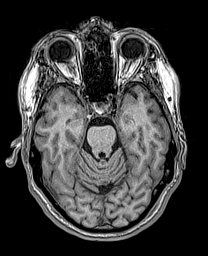
[im 80/160]
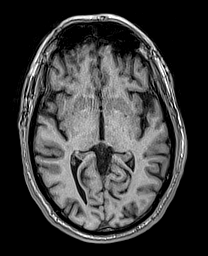
[im 100/160]
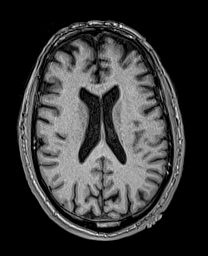
[im 120/160]
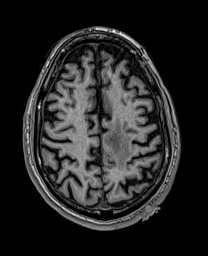
[im 140/160]
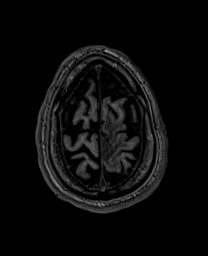
[im 160/160]
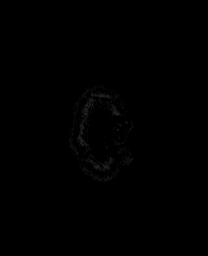

[Series 13: cor dwi_tracew · coronal · 5.0mm · 1.53mm/px · 3 of 62 slices shown]
[im 1/62]
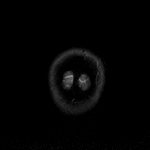
[im 31/62]
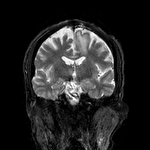
[im 62/62]
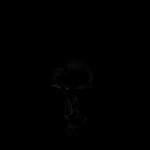

[Series 14: cor dwi_adc · coronal · 5.0mm · 1.53mm/px · 2 of 31 slices shown]
[im 1/31]
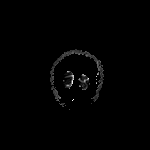
[im 31/31]
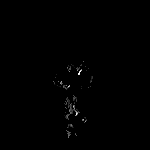

[Series 15: T2 · axial · 5.0mm · 0.45mm/px · 1 of 25 slices shown (2 of 2)]
[im 1/25]
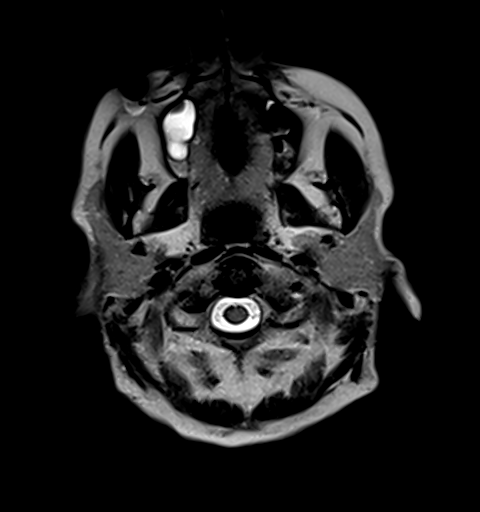

[Series 16: T2 post-contrast · coronal · 5.0mm · 0.57mm/px · 2 of 32 slices shown]
[im 1/32]
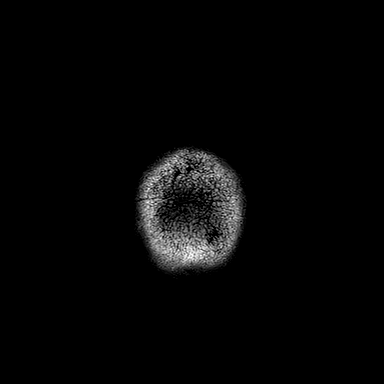
[im 32/32]
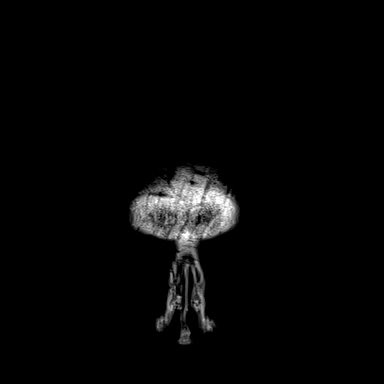

[Series 17: T1 post-contrast · axial · 1.0mm · 0.94mm/px · z∈[-38,+117]mm · 9 of 160 slices shown (1 of 3)]
[im 1/160]
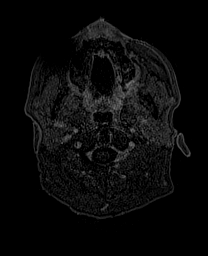
[im 20/160]
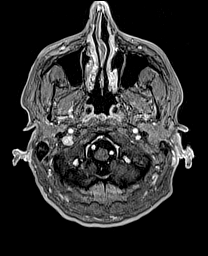
[im 40/160]
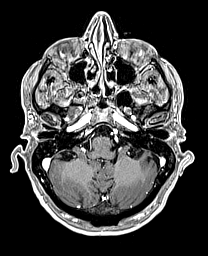
[im 60/160]
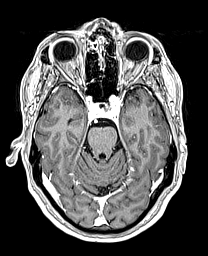
[im 80/160]
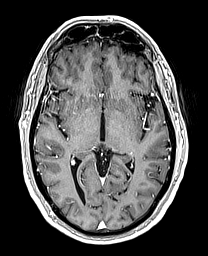
[im 100/160]
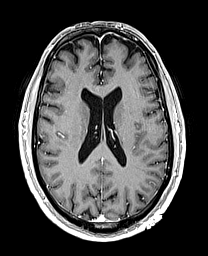
[im 120/160]
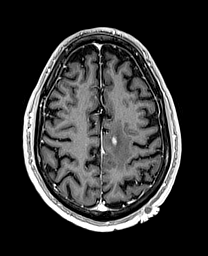
[im 140/160]
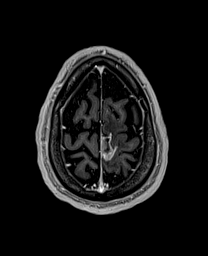
[im 160/160]
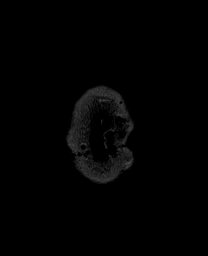

[Series 18: T1 post-contrast · coronal · 5.0mm · 0.43mm/px · 2 of 32 slices shown (2 of 3)]
[im 1/32]
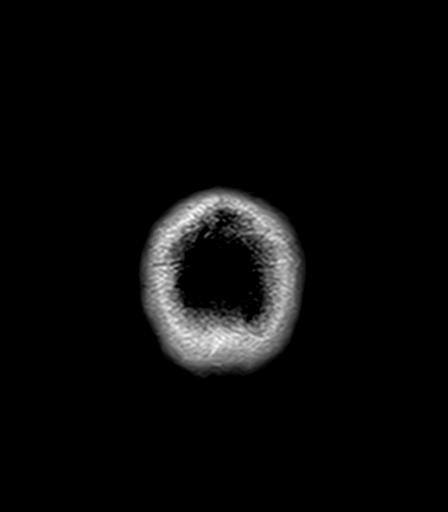
[im 32/32]
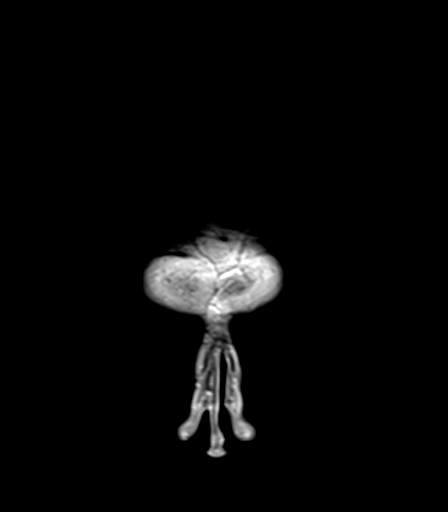

[Series 19: T1 post-contrast · sagittal · 5.0mm · 0.75mm/px · 1 of 26 slices shown (3 of 3)]
[im 1/26]
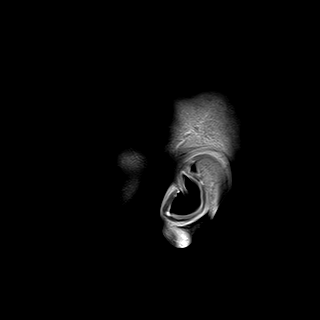

[48 of 48 positions shown; findings below may reference images not displayed]

FINDINGS: Brain: No acute infarct, mass effect or extra-axial collection. No
acute hemorrhage. Chronic blood products at the superior left
hemisphere, unchanged. Region of hyperintense T2-weighted signal in
the superior parasagittal left frontal lobe is slightly worsened.
The contrast-enhancing tumor now measures 3.0 x 1.9 cm, previously
2.4 x 1.6 cm (series 17, image 130). No remote contrast-enhancing
lesions.

Vascular: Major flow voids are preserved.

Skull and upper cervical spine: Normal calvarium and skull base.
Visualized upper cervical spine and soft tissues are normal.

Sinuses/Orbits:No paranasal sinus fluid levels or advanced mucosal
thickening. No mastoid or middle ear effusion. Normal orbits.
IMPRESSION: Tumor progression with increased size of contrast-enhancing tumor in
the superior parasagittal left frontal lobe with increased
surrounding hyperintense T2-weighted signal.

## 2021-03-13 MED ORDER — GADOBUTROL 1 MMOL/ML IV SOLN
8.0000 mL | Freq: Once | INTRAVENOUS | Status: AC | PRN
  Administered 2021-03-13: 8 mL via INTRAVENOUS

## 2021-03-15 ENCOUNTER — Other Ambulatory Visit (HOSPITAL_COMMUNITY): Payer: Self-pay

## 2021-03-15 ENCOUNTER — Ambulatory Visit: Payer: Medicaid Other | Admitting: Internal Medicine

## 2021-03-15 ENCOUNTER — Other Ambulatory Visit: Payer: Self-pay

## 2021-03-15 ENCOUNTER — Inpatient Hospital Stay: Payer: Medicaid Other | Attending: Oncology

## 2021-03-15 ENCOUNTER — Ambulatory Visit (HOSPITAL_COMMUNITY): Payer: Medicaid Other

## 2021-03-15 ENCOUNTER — Inpatient Hospital Stay (HOSPITAL_BASED_OUTPATIENT_CLINIC_OR_DEPARTMENT_OTHER): Payer: Medicaid Other | Admitting: Internal Medicine

## 2021-03-15 VITALS — BP 142/79 | HR 107 | Temp 98.9°F | Resp 18 | Ht 76.0 in | Wt 178.3 lb

## 2021-03-15 DIAGNOSIS — C711 Malignant neoplasm of frontal lobe: Secondary | ICD-10-CM

## 2021-03-15 DIAGNOSIS — R112 Nausea with vomiting, unspecified: Secondary | ICD-10-CM | POA: Insufficient documentation

## 2021-03-15 DIAGNOSIS — Z803 Family history of malignant neoplasm of breast: Secondary | ICD-10-CM | POA: Diagnosis not present

## 2021-03-15 DIAGNOSIS — Z79899 Other long term (current) drug therapy: Secondary | ICD-10-CM | POA: Insufficient documentation

## 2021-03-15 DIAGNOSIS — R569 Unspecified convulsions: Secondary | ICD-10-CM | POA: Diagnosis not present

## 2021-03-15 DIAGNOSIS — R531 Weakness: Secondary | ICD-10-CM | POA: Insufficient documentation

## 2021-03-15 LAB — CBC WITH DIFFERENTIAL (CANCER CENTER ONLY)
Abs Immature Granulocytes: 0.03 10*3/uL (ref 0.00–0.07)
Basophils Absolute: 0 10*3/uL (ref 0.0–0.1)
Basophils Relative: 0 %
Eosinophils Absolute: 0.4 10*3/uL (ref 0.0–0.5)
Eosinophils Relative: 4 %
HCT: 45.7 % (ref 39.0–52.0)
Hemoglobin: 15.6 g/dL (ref 13.0–17.0)
Immature Granulocytes: 0 %
Lymphocytes Relative: 23 %
Lymphs Abs: 2.2 10*3/uL (ref 0.7–4.0)
MCH: 33 pg (ref 26.0–34.0)
MCHC: 34.1 g/dL (ref 30.0–36.0)
MCV: 96.6 fL (ref 80.0–100.0)
Monocytes Absolute: 0.9 10*3/uL (ref 0.1–1.0)
Monocytes Relative: 10 %
Neutro Abs: 5.8 10*3/uL (ref 1.7–7.7)
Neutrophils Relative %: 63 %
Platelet Count: 154 10*3/uL (ref 150–400)
RBC: 4.73 MIL/uL (ref 4.22–5.81)
RDW: 13.1 % (ref 11.5–15.5)
WBC Count: 9.4 10*3/uL (ref 4.0–10.5)
nRBC: 0 % (ref 0.0–0.2)

## 2021-03-15 LAB — CMP (CANCER CENTER ONLY)
ALT: 9 U/L (ref 0–44)
AST: 8 U/L — ABNORMAL LOW (ref 15–41)
Albumin: 3.7 g/dL (ref 3.5–5.0)
Alkaline Phosphatase: 56 U/L (ref 38–126)
Anion gap: 12 (ref 5–15)
BUN: 9 mg/dL (ref 6–20)
CO2: 25 mmol/L (ref 22–32)
Calcium: 9.9 mg/dL (ref 8.9–10.3)
Chloride: 104 mmol/L (ref 98–111)
Creatinine: 0.92 mg/dL (ref 0.61–1.24)
GFR, Estimated: >60 mL/min (ref >60)
Glucose, Bld: 97 mg/dL (ref 70–99)
Potassium: 4 mmol/L (ref 3.5–5.1)
Sodium: 141 mmol/L (ref 135–145)
Total Bilirubin: 0.5 mg/dL (ref 0.3–1.2)
Total Protein: 7.2 g/dL (ref 6.5–8.1)

## 2021-03-15 MED ORDER — TEMOZOLOMIDE 100 MG PO CAPS
200.0000 mg/m2/d | ORAL_CAPSULE | Freq: Every day | ORAL | 0 refills | Status: DC
  Filled 2021-03-15 – 2021-03-21 (×3): qty 20, 5d supply, fill #0

## 2021-03-15 MED ORDER — DEXAMETHASONE 4 MG PO TABS: 4.0000 mg | ORAL_TABLET | Freq: Every day | ORAL | 1 refills | Status: DC

## 2021-03-15 NOTE — Progress Notes (Signed)
[facility] [place], [place] [address] [phone]   Interval Evaluation  Date of Service: 03/15/21 Patient Name: Leonard [name] Patient MRN: [id number] Patient DOB: 1966-05-31 Provider: [name], MD  Identifying Statement:  Leonard [name] is a 55 y.o. male with left frontal glioblastoma   Oncologic History: 06/15/20: Undergoes craniotomy, resection at [facility]; path demonstrates glioblastoma 07/25/20: Completes 3 weeks hypofractionated radiation with Temozolomide ([facility]) 09/29/20: Initiates therapy with 5-day Temozolomide  Biomarkers:  MGMT Unknown.  IDH 1/2 Unknown.  EGFR Unknown  TERT Unknown   Interval History:  Leonard [name] presents today for follow up after completing cycle #5 of 5-day Temodar.  No new or progressive complaints.  No clear change in right sided weakness, still using a walker at times.  Continues on Keppra and Depakote as prior. No issues with MRI scan this week.  H+P (09/22/20) Patient presents today on behalf of Dr. Hinton Rao due to recent progression of tumor.  He describes ongoing right leg weakness, requiring use of a walker; this has not apparently worsened significantly in recent weeks.  He also acknowledges some clumsiness and dysfunction affecting the right arm, although subtle.  Denies fatigue, memory issues.  No recent seizures, he has been compliant with Keppra and Depakote.  Vimpat was discontinued because of expense issues.  Otherwise denies new or progressive neurologic deficits.  Medications: Current Outpatient Medications on File Prior to Visit  Medication Sig Dispense Refill   divalproex (DEPAKOTE) 500 MG DR tablet Take 1 tablet (500 mg total) by mouth 2 (two) times daily. 60 tablet 3   levETIRAcetam (KEPPRA) 750 MG tablet Take 2 tablets (1,500 mg total) by mouth 2 (two) times daily. 120 tablet 1   levOCARNitine (CARNITOR) 330 MG tablet Take 2 tablets (660 mg total) by  mouth 2 (two) times daily. 120 tablet 5   ondansetron (ZOFRAN) 8 MG tablet Take 1 tablet (8 mg total) by mouth 2 (two) times daily as needed (nausea and vomiting). May take 30-60 minutes prior to Temodar administration if nausea/vomiting occurs. (Patient not taking: Reported on 03/15/2021) 30 tablet 1   ondansetron (ZOFRAN) 8 MG tablet TAKE 1 TABLET BY MOUTH 2 TIMES DAILY FOR NAUSEA OR VOMITING. MAY TAKE 30-60 MINUTES PRIOR TO TEMODAR IF NAUSEA OR VOMITING OCCURS (Patient not taking: Reported on 03/15/2021) 30 tablet 1   ondansetron (ZOFRAN) 8 MG tablet Take 1 tablet by mouth 2 times daily as needed (nausea and vomiting). May take 30 - 60 minutes prior to Temodar administration if nausea/vomiting occurs. (Patient not taking: Reported on 03/15/2021) 30 tablet 1   temozolomide (TEMODAR) 100 MG capsule Take 4 capsules (400 mg total) by mouth daily. May take on an empty stomach to decrease nausea & vomiting. (Patient not taking: Reported on 03/15/2021) 20 capsule 0   temozolomide (TEMODAR) 100 MG capsule TAKE 4 CAPSULES (400 MG TOTAL) BY MOUTH DAILY. MAY TAKE ON AN EMPTY STOMACH TO DECREASE NAUSEA & VOMITING. (Patient not taking: Reported on 03/15/2021) 20 capsule 0   temozolomide (TEMODAR) 100 MG capsule Take 4 capsules (400 mg total) by mouth daily. May take on an empty stomach to decrease nausea & vomiting. (Patient not taking: Reported on 03/15/2021) 20 capsule 0   No current facility-administered medications on file prior to visit.    Allergies: No Known Allergies Past Medical History: No past medical history on file. Past Surgical History:  Social History:  Social History   Socioeconomic History  Marital status: Single    Spouse name: Not on file   Number of children: Not on file   Years of education: Not on file   Highest education level: Not on file  Occupational History   Not on file  Tobacco Use   Smoking status: Never   Smokeless tobacco: Never  Substance and Sexual Activity   Alcohol  use: Never   Drug use: Never   Sexual activity: Not Currently  Other Topics Concern   Not on file  Social History Narrative   Not on file   Social Determinants of Health   Financial Resource Strain: Not on file  Food Insecurity: Not on file  Transportation Needs: Not on file  Physical Activity: Not on file  Stress: Not on file  Social Connections: Not on file  Intimate Partner Violence: Not on file   Family History:  Family History  Problem Relation Age of Onset   Breast cancer Sister     Review of Systems: Constitutional: Doesn't report fevers, chills or abnormal weight loss Eyes: Doesn't report blurriness of vision Ears, nose, mouth, throat, and face: Doesn't report sore throat Respiratory: Doesn't report cough, dyspnea or wheezes Cardiovascular: Doesn't report palpitation, chest discomfort  Gastrointestinal:  Doesn't report nausea, constipation, diarrhea GU: Doesn't report incontinence Skin: Doesn't report skin rashes Neurological: Per HPI Musculoskeletal: Doesn't report joint pain Behavioral/Psych: Doesn't report anxiety  Physical Exam: Vitals:   03/15/21 0932  BP: (!) 142/79  Pulse: (!) 107  Resp: 18  Temp: 98.9 F (37.2 C)  SpO2: 100%    KPS: 70. General: Alert, cooperative, pleasant, in no acute distress Head: Normal EENT: No conjunctival injection or scleral icterus.  Lungs: Resp effort normal Cardiac: Regular rate Abdomen: Non-distended abdomen Skin: No rashes cyanosis or petechiae. Extremities: No clubbing or edema  Neurologic Exam: Mental Status: Awake, alert, attentive to examiner. Oriented to self and environment. Language is fluent with intact comprehension.  Cranial Nerves: Visual acuity is grossly normal. Visual fields are full. Extra-ocular movements intact. No ptosis. Face is symmetric Motor: Tone and bulk are normal. Power is 4/5 in right leg, 4++/5 in right arm. Reflexes are symmetric, no pathologic reflexes present.  Sensory: Intact  to light touch Gait: Hemiparetic   Labs: I have reviewed the data as listed    Component Value Date/Time   NA 143 02/08/2021 0912   NA 142 08/17/2020 0000   K 3.8 02/08/2021 0912   CL 106 02/08/2021 0912   CO2 27 02/08/2021 0912   GLUCOSE 73 02/08/2021 0912   BUN 7 02/08/2021 0912   BUN 16 08/17/2020 0000   CREATININE 0.82 02/08/2021 0912   CALCIUM 9.9 02/08/2021 0912   PROT 7.0 02/08/2021 0912   ALBUMIN 3.7 02/08/2021 0912   AST 10 (L) 02/08/2021 0912   ALT 10 02/08/2021 0912   ALKPHOS 48 02/08/2021 0912   BILITOT 0.6 02/08/2021 0912   GFRNONAA >60 02/08/2021 0912   Lab Results  Component Value Date   WBC 9.4 03/15/2021   NEUTROABS 5.8 03/15/2021   HGB 15.6 03/15/2021   HCT 45.7 03/15/2021   MCV 96.6 03/15/2021   PLT 154 03/15/2021   Imaging:  [place] Clinician Interpretation: I have personally reviewed the CNS images as listed.  My interpretation, in the context of the patient's clinical presentation, is treatment effect vs true progression  MR BRAIN W WO CONTRAST  Result Date: 03/13/2021 CLINICAL DATA:  Follow-up of glioblastoma multiforme. Completion of cycle 4 of Temodar. Oncologic History:06/15/20: Undergoes craniotomy,  resection at [facility]; path demonstrates glioblastoma12/21/21: Completes 3 weeks hypofractionated radiation with Temozolomide ([facility])09/29/20: Initiates therapy with 5-day Temozolomide EXAM: MRI HEAD WITHOUT AND WITH CONTRAST TECHNIQUE: Multiplanar, multiecho pulse sequences of the brain and surrounding structures were obtained without and with intravenous contrast. CONTRAST:  43m GADAVIST GADOBUTROL 1 MMOL/ML IV SOLN COMPARISON:  12/05/2020 FINDINGS: Brain: No acute infarct, mass effect or extra-axial collection. No acute hemorrhage. Chronic blood products at the superior left hemisphere, unchanged. Region of hyperintense T2-weighted signal in the superior parasagittal left frontal lobe is slightly worsened. The contrast-enhancing tumor now measures 3.0 x  1.9 cm, previously 2.4 x 1.6 cm (series 17, image 130). No remote contrast-enhancing lesions. Vascular: Major flow voids are preserved. Skull and upper cervical spine: Normal calvarium and skull base. Visualized upper cervical spine and soft tissues are normal. Sinuses/Orbits:No paranasal sinus fluid levels or advanced mucosal thickening. No mastoid or middle ear effusion. Normal orbits. IMPRESSION: Tumor progression with increased size of contrast-enhancing tumor in the superior parasagittal left frontal lobe with increased surrounding hyperintense T2-weighted signal. Electronically Signed   By: KUlyses JarredM.D.   On: 03/13/2021 11:40     Assessment/Plan 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[place] [C71.1]  KMansfield DannWall is clinically stable today, now having completed 5 cycles of adjuvant Temozolomide.  MRI brain demonstrates local progression of disease; etiology is either organic progression of tumor or pseudoprogression.  Right leg motor function is objectively stable.  We recommended following these changes carefully with repeat MRI brain in 1 month.    In the interim, we recommended continuing treatment with cycle #6 Temozolomide 2048mm2, on for five days and off for twenty three days in twenty eight day cycles. The patient will have a complete blood count performed on days 21 and 28 of each cycle, and a comprehensive metabolic panel performed on day 28 of each cycle. Labs may need to be performed more often. Zofran will prescribed for home use for nausea/vomiting.   Chemotherapy should be held for the following:  ANC less than 1,000  Platelets less than 100,000  LFT or creatinine greater than 2x ULN  If clinical concerns/contraindications develop  Decadron will increase to 16m18maily starting on 03/29/21, two weeks prior to MRI date.  He should start this steroid dose earlier if and when he develops worsening right leg weakness.  Can will be ordered due to right leg and gait  dysfunction, disability.  Should continue Keppra 1500m60mD, Depakote can decrease to 500mg58m.  KerryJemel Ono should return to clinic next month following MRI study with labs for evaluation.  All questions were answered. The patient knows to call the clinic with any problems, questions or concerns. No barriers to learning were detected.  The total time spent in the encounter was 40 minutes and more than 50% was on counseling and review of test results   ZachaVentura SellersMedical Director of Neuro-Oncology [facility]1/22 9:42 AM

## 2021-03-16 ENCOUNTER — Other Ambulatory Visit: Payer: Self-pay | Admitting: Internal Medicine

## 2021-03-16 ENCOUNTER — Telehealth: Payer: Self-pay | Admitting: Internal Medicine

## 2021-03-16 NOTE — Telephone Encounter (Signed)
Scheduled appts per 8/11 los. Called pt, no answer. Left msg with appts date and time.

## 2021-03-19 ENCOUNTER — Inpatient Hospital Stay: Payer: Medicaid Other

## 2021-03-19 ENCOUNTER — Encounter: Payer: Self-pay | Admitting: Internal Medicine

## 2021-03-19 ENCOUNTER — Other Ambulatory Visit (HOSPITAL_COMMUNITY): Payer: Self-pay

## 2021-03-19 ENCOUNTER — Other Ambulatory Visit: Payer: Self-pay | Admitting: *Deleted

## 2021-03-21 ENCOUNTER — Encounter: Payer: Self-pay | Admitting: Internal Medicine

## 2021-03-21 ENCOUNTER — Other Ambulatory Visit (HOSPITAL_COMMUNITY): Payer: Self-pay

## 2021-03-26 ENCOUNTER — Other Ambulatory Visit (HOSPITAL_COMMUNITY): Payer: Self-pay

## 2021-04-05 ENCOUNTER — Encounter: Payer: Self-pay | Admitting: Internal Medicine

## 2021-04-10 ENCOUNTER — Other Ambulatory Visit (HOSPITAL_COMMUNITY): Payer: Self-pay

## 2021-04-11 ENCOUNTER — Other Ambulatory Visit: Payer: Self-pay

## 2021-04-11 ENCOUNTER — Ambulatory Visit (HOSPITAL_COMMUNITY)
Admission: RE | Admit: 2021-04-11 | Discharge: 2021-04-11 | Disposition: A | Discharge status: Home / Self Care | Payer: Medicaid Other | Source: Ambulatory Visit | Attending: Internal Medicine | Admitting: Internal Medicine

## 2021-04-11 DIAGNOSIS — C711 Malignant neoplasm of frontal lobe: Secondary | ICD-10-CM | POA: Diagnosis not present

## 2021-04-11 IMAGING — MR MR HEAD WO/W CM
13 series · 48 of 48 positions shown · IV contrast (gadavist)
Comparison: [DATE]

CLINICAL DATA: Follow-up GBM.  Assess treatment response

EXAM:
MRI HEAD WITHOUT AND WITH CONTRAST
TECHNIQUE: Multiplanar, multiecho pulse sequences of the brain and surrounding
structures were obtained without and with intravenous contrast.
CONTRAST:  8mL GADAVIST GADOBUTROL 1 MMOL/ML IV SOLN

[Series 5: DWI · axial · 3.0mm · 1.36mm/px · z∈[-21,+140]mm · 6 of 112 slices shown (1 of 2)]
[im 1/112]
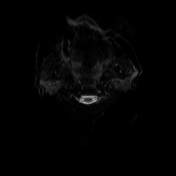
[im 23/112]
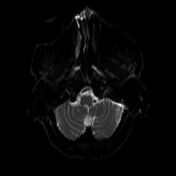
[im 45/112]
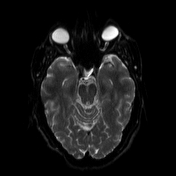
[im 67/112]
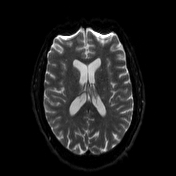
[im 89/112]
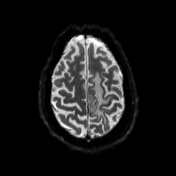
[im 112/112]
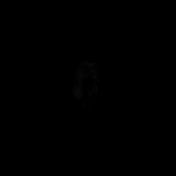

[Series 6: DWI · axial · 3.0mm · 1.36mm/px · z∈[-21,+140]mm · 3 of 56 slices shown (2 of 2)]
[im 1/56]
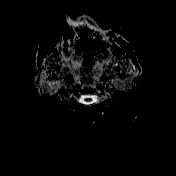
[im 28/56]
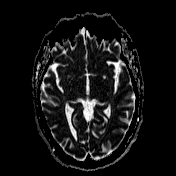
[im 56/56]
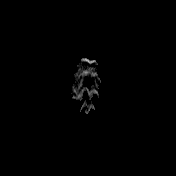

[Series 7: T1 · sagittal · 5.0mm · 0.75mm/px · 2 of 26 slices shown (1 of 2)]
[im 1/26]
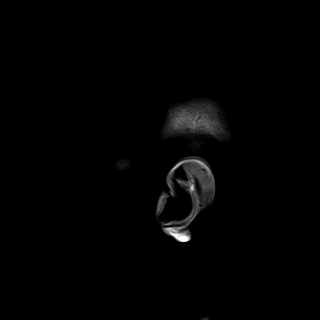
[im 26/26]
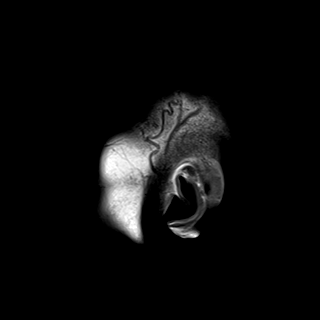

[Series 8: T2 · axial · 5.0mm · 0.62mm/px · 1 of 25 slices shown]
[im 1/25]
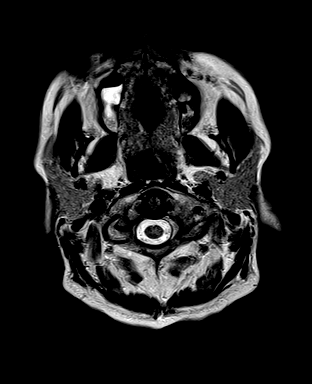

[Series 9: swi_images · axial · 3.0mm · 0.75mm/px · z∈[-10,+138]mm · 3 of 52 slices shown]
[im 1/52]
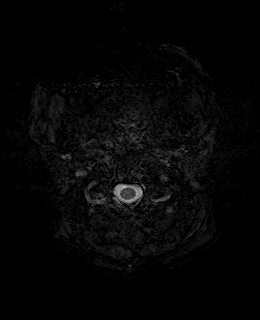
[im 26/52]
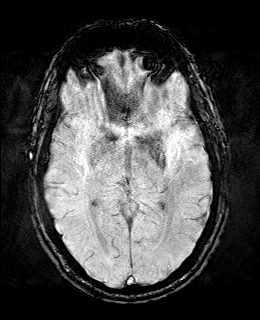
[im 52/52]
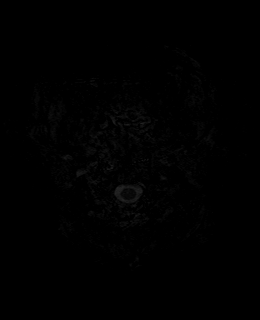

[Series 11: FLAIR · axial · 3.0mm · 0.75mm/px · z∈[-10,+138]mm · 3 of 52 slices shown]
[im 1/52]
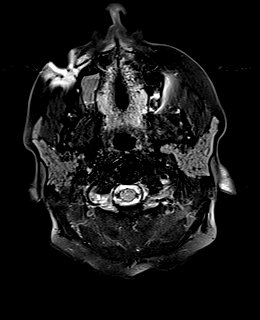
[im 26/52]
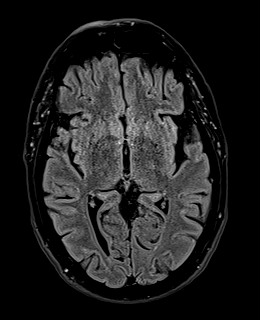
[im 52/52]
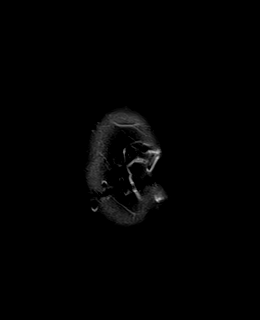

[Series 12: T1 · axial · 1.0mm · 0.94mm/px · z∈[-13,+141]mm · 9 of 160 slices shown (2 of 2)]
[im 1/160]
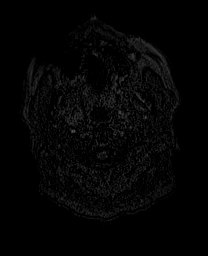
[im 20/160]
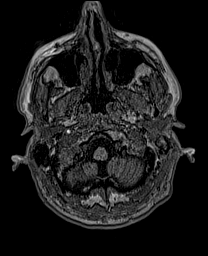
[im 40/160]
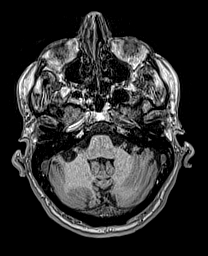
[im 60/160]
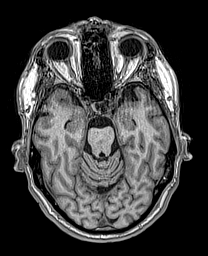
[im 80/160]
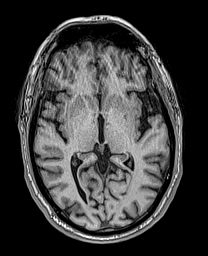
[im 100/160]
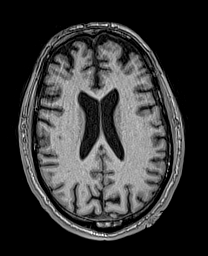
[im 120/160]
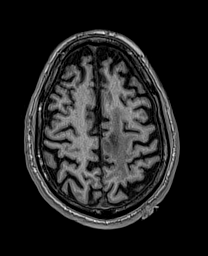
[im 140/160]
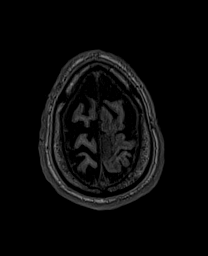
[im 160/160]
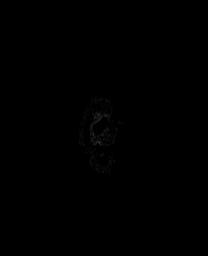

[Series 13: cor dwi_tracew · coronal · 5.0mm · 1.53mm/px · 4 of 62 slices shown]
[im 1/62]
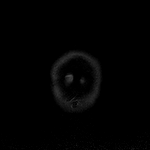
[im 21/62]
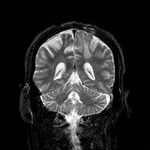
[im 41/62]
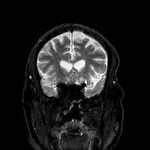
[im 62/62]
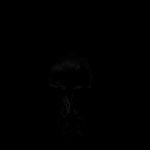

[Series 14: cor dwi_adc · coronal · 5.0mm · 1.53mm/px · 2 of 31 slices shown]
[im 1/31]
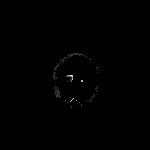
[im 31/31]
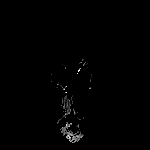

[Series 15: T2 post-contrast · coronal · 5.0mm · 0.57mm/px · 2 of 32 slices shown]
[im 1/32]
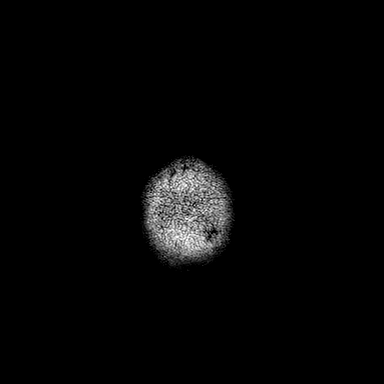
[im 32/32]
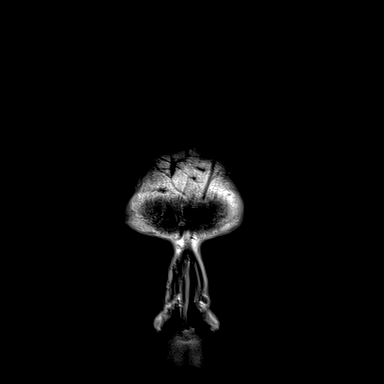

[Series 16: T1 post-contrast · axial · 1.0mm · 0.94mm/px · z∈[-13,+141]mm · 9 of 160 slices shown (1 of 3)]
[im 1/160]
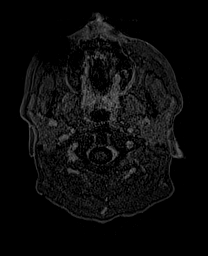
[im 20/160]
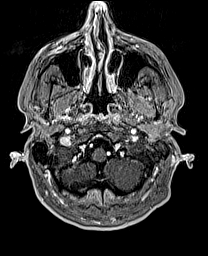
[im 40/160]
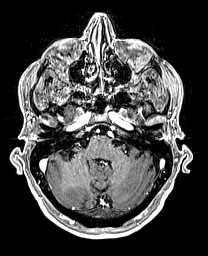
[im 60/160]
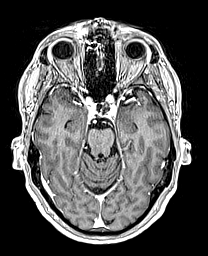
[im 80/160]
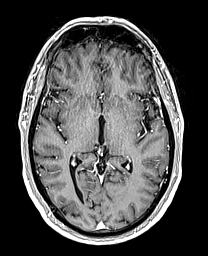
[im 100/160]
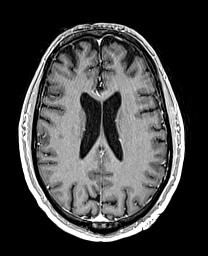
[im 120/160]
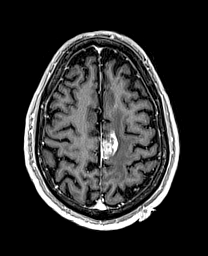
[im 140/160]
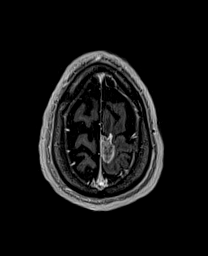
[im 160/160]
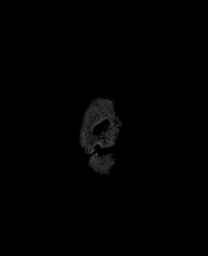

[Series 17: T1 post-contrast · coronal · 5.0mm · 0.43mm/px · 2 of 32 slices shown (2 of 3)]
[im 1/32]
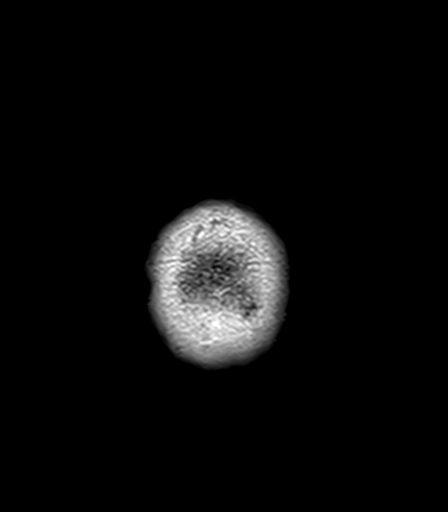
[im 32/32]
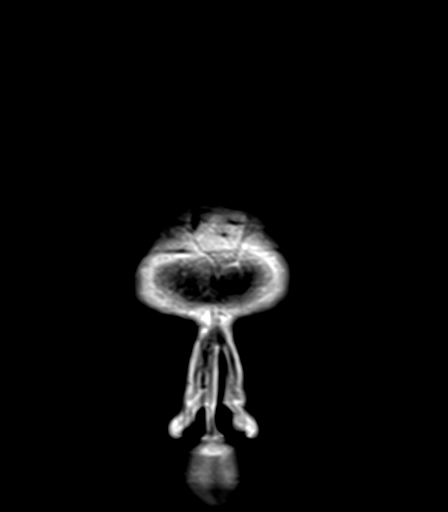

[Series 18: T1 post-contrast · sagittal · 5.0mm · 0.75mm/px · 2 of 26 slices shown (3 of 3)]
[im 1/26]
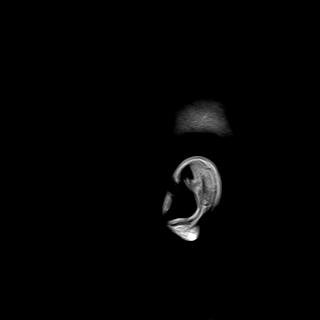
[im 26/26]
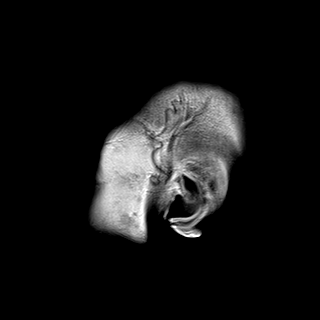

[48 of 48 positions shown; findings below may reference images not displayed]

FINDINGS: Brain: Enhancing left parasagittal and posterior frontal mass has
increased in bulk to a mild degree. On [DATE] dimensions are 33 x 19
mm as compared to 31 x 19 mm. Craniocaudal span on [DATE] is 29 mm as
compared to 27 mm previously. Adjacent T2 hyperintensity is also
mildly increased, extending further along gyri both anteriorly and
posteriorly. Chronic blood products. No acute hemorrhage, acute
infarct, hydrocephalus, or shift.

Vascular: Normal flow voids and vascular enhancements.

Skull and upper cervical spine: Unremarkable craniotomy

Sinuses/Orbits: Unremarkable
IMPRESSION: Compared to [DATE] the mass has increased by ~2 mm and there
is minimally increased T2 signal.

## 2021-04-11 MED ORDER — GADOBUTROL 1 MMOL/ML IV SOLN
8.0000 mL | Freq: Once | INTRAVENOUS | Status: AC | PRN
  Administered 2021-04-11: 8 mL via INTRAVENOUS

## 2021-04-12 ENCOUNTER — Other Ambulatory Visit (HOSPITAL_COMMUNITY): Payer: Self-pay

## 2021-04-16 ENCOUNTER — Inpatient Hospital Stay: Payer: Medicaid Other | Attending: Oncology

## 2021-04-16 ENCOUNTER — Inpatient Hospital Stay (HOSPITAL_BASED_OUTPATIENT_CLINIC_OR_DEPARTMENT_OTHER): Payer: Medicaid Other | Admitting: Internal Medicine

## 2021-04-16 ENCOUNTER — Other Ambulatory Visit: Payer: Self-pay

## 2021-04-16 ENCOUNTER — Inpatient Hospital Stay: Payer: Medicaid Other

## 2021-04-16 VITALS — BP 145/94 | HR 67 | Temp 97.9°F | Resp 18 | Ht 76.0 in | Wt 182.4 lb

## 2021-04-16 DIAGNOSIS — Z7952 Long term (current) use of systemic steroids: Secondary | ICD-10-CM | POA: Insufficient documentation

## 2021-04-16 DIAGNOSIS — R531 Weakness: Secondary | ICD-10-CM | POA: Insufficient documentation

## 2021-04-16 DIAGNOSIS — C711 Malignant neoplasm of frontal lobe: Secondary | ICD-10-CM | POA: Diagnosis not present

## 2021-04-16 DIAGNOSIS — Z803 Family history of malignant neoplasm of breast: Secondary | ICD-10-CM | POA: Diagnosis not present

## 2021-04-16 DIAGNOSIS — Z79899 Other long term (current) drug therapy: Secondary | ICD-10-CM | POA: Insufficient documentation

## 2021-04-16 DIAGNOSIS — R569 Unspecified convulsions: Secondary | ICD-10-CM

## 2021-04-16 LAB — CBC WITH DIFFERENTIAL (CANCER CENTER ONLY)
Abs Immature Granulocytes: 0.03 10*3/uL (ref 0.00–0.07)
Basophils Absolute: 0 10*3/uL (ref 0.0–0.1)
Basophils Relative: 0 %
Eosinophils Absolute: 0.1 10*3/uL (ref 0.0–0.5)
Eosinophils Relative: 1 %
HCT: 44.7 % (ref 39.0–52.0)
Hemoglobin: 15.1 g/dL (ref 13.0–17.0)
Immature Granulocytes: 0 %
Lymphocytes Relative: 43 %
Lymphs Abs: 3.5 10*3/uL (ref 0.7–4.0)
MCH: 32.8 pg (ref 26.0–34.0)
MCHC: 33.8 g/dL (ref 30.0–36.0)
MCV: 97 fL (ref 80.0–100.0)
Monocytes Absolute: 0.7 10*3/uL (ref 0.1–1.0)
Monocytes Relative: 8 %
Neutro Abs: 3.8 10*3/uL (ref 1.7–7.7)
Neutrophils Relative %: 48 %
Platelet Count: 168 10*3/uL (ref 150–400)
RBC: 4.61 MIL/uL (ref 4.22–5.81)
RDW: 13.9 % (ref 11.5–15.5)
WBC Count: 8.1 10*3/uL (ref 4.0–10.5)
nRBC: 0 % (ref 0.0–0.2)

## 2021-04-16 LAB — CMP (CANCER CENTER ONLY)
ALT: 14 U/L (ref 0–44)
AST: 9 U/L — ABNORMAL LOW (ref 15–41)
Albumin: 3.8 g/dL (ref 3.5–5.0)
Alkaline Phosphatase: 43 U/L (ref 38–126)
Anion gap: 10 (ref 5–15)
BUN: 18 mg/dL (ref 6–20)
CO2: 25 mmol/L (ref 22–32)
Calcium: 9.8 mg/dL (ref 8.9–10.3)
Chloride: 108 mmol/L (ref 98–111)
Creatinine: 1 mg/dL (ref 0.61–1.24)
GFR, Estimated: >60 mL/min (ref >60)
Glucose, Bld: 85 mg/dL (ref 70–99)
Potassium: 4.1 mmol/L (ref 3.5–5.1)
Sodium: 143 mmol/L (ref 135–145)
Total Bilirubin: 0.3 mg/dL (ref 0.3–1.2)
Total Protein: 6.8 g/dL (ref 6.5–8.1)

## 2021-04-16 NOTE — Progress Notes (Signed)
[facility] [place], [place] [address] [phone]   Interval Evaluation  Date of Service: 04/16/21 Patient Name: Leonard [name] Patient MRN: [id number] Patient DOB: 1965-08-16 Provider: [name], MD  Identifying Statement:  Leonard [name] is a 55 y.o. male with left frontal glioblastoma   Oncologic History: 06/15/20: Undergoes craniotomy, resection at [facility]; path demonstrates glioblastoma 07/25/20: Completes 3 weeks hypofractionated radiation with Temozolomide ([facility]) 09/29/20: Initiates therapy with 5-day Temozolomide  Biomarkers:  MGMT Unknown.  IDH 1/2 Unknown.  EGFR Unknown  TERT Unknown   Interval History:  Weldon Nouri presents today for follow up after completing cycle #6 of 5-day Temodar.  No new or progressive complaints.  No clear change in right sided weakness, still using a walker at times.  Has been on decadron 20m daily for the past couple of weeks. Continues on Keppra and Depakote as prior. No issues with MRI scan this week.  H+P (09/22/20) Patient presents today on behalf of Dr. MHinton Raodue to recent progression of tumor.  He describes ongoing right leg weakness, requiring use of a walker; this has not apparently worsened significantly in recent weeks.  He also acknowledges some clumsiness and dysfunction affecting the right arm, although subtle.  Denies fatigue, memory issues.  No recent seizures, he has been compliant with Keppra and Depakote.  Vimpat was discontinued because of expense issues.  Otherwise denies new or progressive neurologic deficits.  Medications: Current Outpatient Medications on File Prior to Visit  Medication Sig Dispense Refill   dexamethasone (DECADRON) 4 MG tablet Take 1 tablet (4 mg total) by mouth daily. 60 tablet 1   divalproex (DEPAKOTE) 500 MG DR tablet Take 1 tablet (500 mg total) by mouth 2 (two) times daily. 60 tablet 3   levETIRAcetam (KEPPRA) 750 MG  tablet Take 2 tablets (1,500 mg total) by mouth 2 (two) times daily. 120 tablet 1   levOCARNitine (CARNITOR) 330 MG tablet Take 2 tablets (660 mg total) by mouth 2 (two) times daily. 120 tablet 5   ondansetron (ZOFRAN) 8 MG tablet Take 1 tablet (8 mg total) by mouth 2 (two) times daily as needed (nausea and vomiting). May take 30-60 minutes prior to Temodar administration if nausea/vomiting occurs. (Patient not taking: No sig reported) 30 tablet 1   ondansetron (ZOFRAN) 8 MG tablet TAKE 1 TABLET BY MOUTH 2 TIMES DAILY FOR NAUSEA OR VOMITING. MAY TAKE 30-60 MINUTES PRIOR TO TEMODAR IF NAUSEA OR VOMITING OCCURS (Patient not taking: No sig reported) 30 tablet 1   ondansetron (ZOFRAN) 8 MG tablet Take 1 tablet by mouth 2 times daily as needed (nausea and vomiting). May take 30 - 60 minutes prior to Temodar administration if nausea/vomiting occurs. (Patient not taking: No sig reported) 30 tablet 1   temozolomide (TEMODAR) 100 MG capsule Take 4 capsules (400 mg total) by mouth daily. May take on an empty stomach to decrease nausea & vomiting. (Patient not taking: No sig reported) 20 capsule 0   temozolomide (TEMODAR) 100 MG capsule TAKE 4 CAPSULES (400 MG TOTAL) BY MOUTH DAILY. MAY TAKE ON AN EMPTY STOMACH TO DECREASE NAUSEA & VOMITING. (Patient not taking: No sig reported) 20 capsule 0   temozolomide (TEMODAR) 100 MG capsule Take 4 capsules (400 mg total) by mouth daily. May take on an empty stomach to decrease nausea & vomiting. (Patient not taking: Reported on 04/16/2021) 20 capsule 0   No current facility-administered medications on file prior to  visit.    Allergies: No Known Allergies Past Medical History: No past medical history on file. Past Surgical History:  Social History:  Social History   Socioeconomic History   Marital status: Single    Spouse name: Not on file   Number of children: Not on file   Years of education: Not on file   Highest education level: Not on file  Occupational  History   Not on file  Tobacco Use   Smoking status: Never   Smokeless tobacco: Never  Substance and Sexual Activity   Alcohol use: Never   Drug use: Never   Sexual activity: Not Currently  Other Topics Concern   Not on file  Social History Narrative   Not on file   Social Determinants of Health   Financial Resource Strain: Not on file  Food Insecurity: Not on file  Transportation Needs: Not on file  Physical Activity: Not on file  Stress: Not on file  Social Connections: Not on file  Intimate Partner Violence: Not on file   Family History:  Family History  Problem Relation Age of Onset   Breast cancer Sister     Review of Systems: Constitutional: Doesn't report fevers, chills or abnormal weight loss Eyes: Doesn't report blurriness of vision Ears, nose, mouth, throat, and face: Doesn't report sore throat Respiratory: Doesn't report cough, dyspnea or wheezes Cardiovascular: Doesn't report palpitation, chest discomfort  Gastrointestinal:  Doesn't report nausea, constipation, diarrhea GU: Doesn't report incontinence Skin: Doesn't report skin rashes Neurological: Per HPI Musculoskeletal: Doesn't report joint pain Behavioral/Psych: Doesn't report anxiety  Physical Exam: Vitals:   04/16/21 0850  BP: (!) 145/94  Pulse: 67  Resp: 18  Temp: 97.9 F (36.6 C)  SpO2: 97%    KPS: 70. General: Alert, cooperative, pleasant, in no acute distress Head: Normal EENT: No conjunctival injection or scleral icterus.  Lungs: Resp effort normal Cardiac: Regular rate Abdomen: Non-distended abdomen Skin: No rashes cyanosis or petechiae. Extremities: No clubbing or edema  Neurologic Exam: Mental Status: Awake, alert, attentive to examiner. Oriented to self and environment. Language is fluent with intact comprehension.  Cranial Nerves: Visual acuity is grossly normal. Visual fields are full. Extra-ocular movements intact. No ptosis. Face is symmetric Motor: Tone and bulk are  normal. Power is 4/5 in right leg, 4++/5 in right arm. Reflexes are symmetric, no pathologic reflexes present.  Sensory: Intact to light touch Gait: Hemiparetic   Labs: I have reviewed the data as listed    Component Value Date/Time   NA 143 04/16/2021 0829   NA 142 08/17/2020 0000   K 4.1 04/16/2021 0829   CL 108 04/16/2021 0829   CO2 25 04/16/2021 0829   GLUCOSE 85 04/16/2021 0829   BUN 18 04/16/2021 0829   BUN 16 08/17/2020 0000   CREATININE 1.00 04/16/2021 0829   CALCIUM 9.8 04/16/2021 0829   PROT 6.8 04/16/2021 0829   ALBUMIN 3.8 04/16/2021 0829   AST 9 (L) 04/16/2021 0829   ALT 14 04/16/2021 0829   ALKPHOS 43 04/16/2021 0829   BILITOT 0.3 04/16/2021 0829   GFRNONAA >60 04/16/2021 0829   Lab Results  Component Value Date   WBC 8.1 04/16/2021   NEUTROABS 3.8 04/16/2021   HGB 15.1 04/16/2021   HCT 44.7 04/16/2021   MCV 97.0 04/16/2021   PLT 168 04/16/2021   Imaging:  [place] Clinician Interpretation: I have personally reviewed the CNS images as listed.  My interpretation, in the context of the patient's clinical presentation, is progressive  disease  MR BRAIN W WO CONTRAST  Result Date: 04/11/2021 CLINICAL DATA:  Follow-up GBM.  Assess treatment response EXAM: MRI HEAD WITHOUT AND WITH CONTRAST TECHNIQUE: Multiplanar, multiecho pulse sequences of the brain and surrounding structures were obtained without and with intravenous contrast. CONTRAST:  50m GADAVIST GADOBUTROL 1 MMOL/ML IV SOLN COMPARISON:  03/13/2016 FINDINGS: Brain: Enhancing left parasagittal and posterior frontal mass has increased in bulk to a mild degree. On 16:127 dimensions are 33 x 19 mm as compared to 31 x 19 mm. Craniocaudal span on 17:14 is 29 mm as compared to 27 mm previously. Adjacent T2 hyperintensity is also mildly increased, extending further along gyri both anteriorly and posteriorly. Chronic blood products. No acute hemorrhage, acute infarct, hydrocephalus, or shift. Vascular: Normal flow voids and  vascular enhancements. Skull and upper cervical spine: Unremarkable craniotomy Sinuses/Orbits: Unremarkable IMPRESSION: Compared to 03/13/2021 the mass has increased by ~2 mm and there is minimally increased T2 signal. Electronically Signed   By: JJorje GuildM.D.   On: 04/11/2021 21:19     Assessment/Plan 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[place] [C71.1]  KDuward AllbrittonWall is clinically stable today, now having completed 6 cycles of adjuvant Temozolomide.  MRI brain again demonstrates local progression of disease after short term follow up; this is in spite of dosing corticosteroids.  Right leg motor function is objectively stable.  We extensively reviewed treatment plan pathways moving forward for progressive glioblastoma.  Craniotomy/resection will not be an option because of tumor location within right leg notch.  Because he only received 3 weeks of IMRT, consideration will be given to potential role of LITT in obtaining additional local control and cytoreduction.  [facility].    We also discussed and patient consented for additional tumor profiling and sequencing through [place]  Advanced tumor profiling could help identify actionable mutation for targeted therapy and lead to direct clinical benefit.  For salvage chemotherapy, we discussed potential role of CCNU, in this case without Avastin given lack of bulky disease and low symptom burden.       Decadron should be decreased to 248mdaily in the interim.  Should continue Keppra 150065mID, Depakote can decrease to 500m66mD.  KerrVela Prosel will return pending input from LITT assessment and [facility] sequencing.    All questions were answered. The patient knows to call the clinic with any problems, questions or concerns. No barriers to learning were detected.  The total time spent in the encounter was 40 minutes and more than 50% was on counseling and review of test  results   ZachVentura Sellers Medical Director of Neuro-Oncology [facility]12/22 10:30 AM

## 2021-04-16 NOTE — Progress Notes (Signed)
Completed and faxed [facility] tumor requisition

## 2021-04-17 ENCOUNTER — Other Ambulatory Visit (HOSPITAL_COMMUNITY): Payer: Self-pay

## 2021-04-30 ENCOUNTER — Other Ambulatory Visit (HOSPITAL_COMMUNITY): Payer: Self-pay

## 2021-05-07 ENCOUNTER — Other Ambulatory Visit (HOSPITAL_COMMUNITY): Payer: Self-pay

## 2021-05-07 ENCOUNTER — Other Ambulatory Visit: Payer: Self-pay | Admitting: Internal Medicine

## 2021-05-07 DIAGNOSIS — R569 Unspecified convulsions: Secondary | ICD-10-CM

## 2021-05-07 DIAGNOSIS — C711 Malignant neoplasm of frontal lobe: Secondary | ICD-10-CM

## 2021-05-07 MED ORDER — LEVETIRACETAM 750 MG PO TABS
1500.0000 mg | ORAL_TABLET | Freq: Two times a day (BID) | ORAL | 1 refills | Status: DC
  Filled 2021-05-07: qty 120, 30d supply, fill #0

## 2021-05-14 ENCOUNTER — Other Ambulatory Visit: Payer: Self-pay | Admitting: *Deleted

## 2021-05-14 DIAGNOSIS — C711 Malignant neoplasm of frontal lobe: Secondary | ICD-10-CM

## 2021-05-17 ENCOUNTER — Other Ambulatory Visit: Payer: Self-pay | Admitting: *Deleted

## 2021-05-17 DIAGNOSIS — C711 Malignant neoplasm of frontal lobe: Secondary | ICD-10-CM

## 2021-06-07 ENCOUNTER — Other Ambulatory Visit: Payer: Self-pay

## 2021-06-07 ENCOUNTER — Other Ambulatory Visit: Payer: Self-pay | Admitting: Internal Medicine

## 2021-06-07 ENCOUNTER — Ambulatory Visit (HOSPITAL_COMMUNITY)
Admission: RE | Admit: 2021-06-07 | Discharge: 2021-06-07 | Disposition: A | Discharge status: Home / Self Care | Payer: Medicaid Other | Source: Ambulatory Visit | Attending: Internal Medicine | Admitting: Internal Medicine

## 2021-06-07 ENCOUNTER — Telehealth: Payer: Self-pay | Admitting: *Deleted

## 2021-06-07 DIAGNOSIS — R569 Unspecified convulsions: Secondary | ICD-10-CM

## 2021-06-07 DIAGNOSIS — C711 Malignant neoplasm of frontal lobe: Secondary | ICD-10-CM

## 2021-06-07 IMAGING — MR MR HEAD WO/W CM
13 series · 48 of 48 positions shown · IV contrast (gadavist)
Comparison: [DATE] and earlier.

CLINICAL DATA: 55-year-old male with glioblastoma.  Restaging.

EXAM:
MRI HEAD WITHOUT AND WITH CONTRAST
TECHNIQUE: Multiplanar, multiecho pulse sequences of the brain and surrounding
structures were obtained without and with intravenous contrast.
CONTRAST:  8mL GADAVIST GADOBUTROL 1 MMOL/ML IV SOLN

[Series 5: DWI · axial · 3.0mm · 1.36mm/px · z∈[-62,+105]mm · 7 of 116 slices shown (1 of 2)]
[im 1/116]
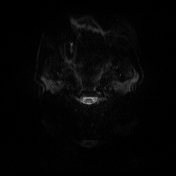
[im 20/116]
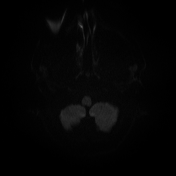
[im 39/116]
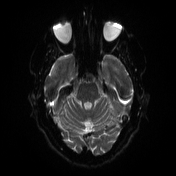
[im 58/116]
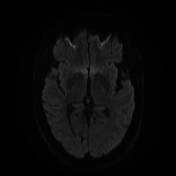
[im 77/116]
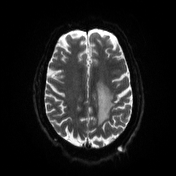
[im 96/116]
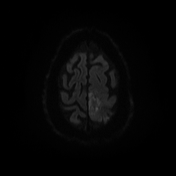
[im 116/116]
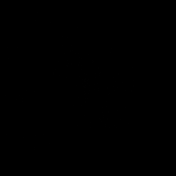

[Series 6: DWI · axial · 3.0mm · 1.36mm/px · z∈[-62,+102]mm · 3 of 57 slices shown (2 of 2)]
[im 1/57]
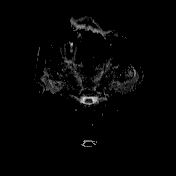
[im 29/57]
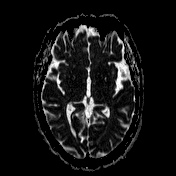
[im 57/57]
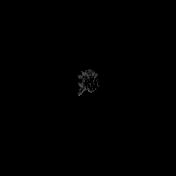

[Series 7: T1 · sagittal · 5.0mm · 0.75mm/px · 1 of 26 slices shown (1 of 2)]
[im 1/26]
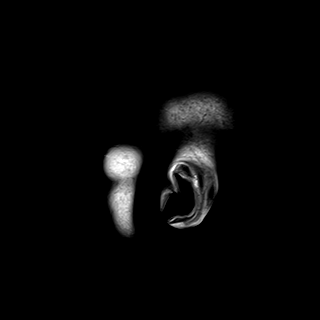

[Series 8: T2 · axial · 5.0mm · 0.62mm/px · z∈[-59,+105]mm · 2 of 27 slices shown]
[im 1/27]
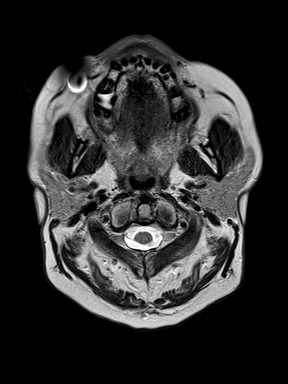
[im 27/27]
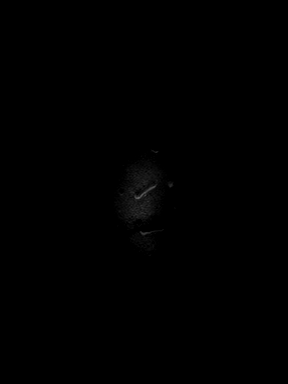

[Series 9: swi_images · axial · 3.0mm · 0.75mm/px · z∈[-57,+103]mm · 3 of 56 slices shown]
[im 1/56]
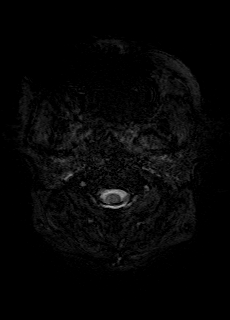
[im 28/56]
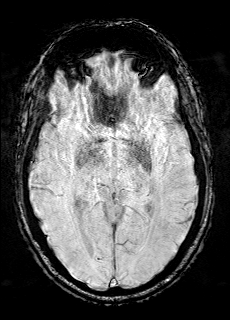
[im 56/56]
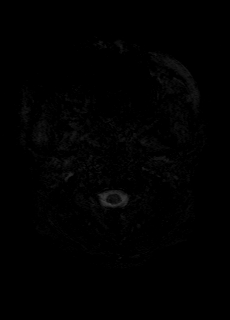

[Series 11: FLAIR · axial · 3.0mm · 0.75mm/px · z∈[-54,+100]mm · 3 of 54 slices shown]
[im 1/54]
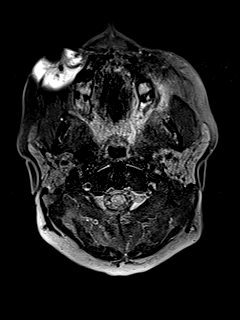
[im 27/54]
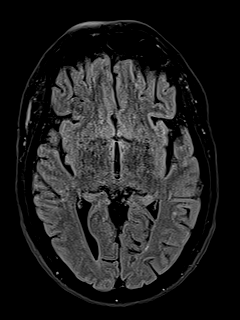
[im 54/54]
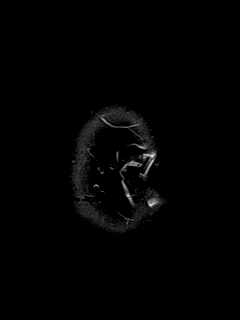

[Series 12: T1 · axial · 1.0mm · 0.94mm/px · z∈[-54,+100]mm · 9 of 160 slices shown (2 of 2)]
[im 1/160]
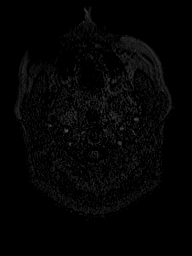
[im 20/160]
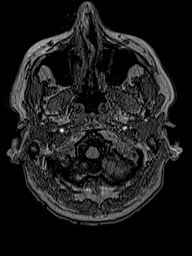
[im 40/160]
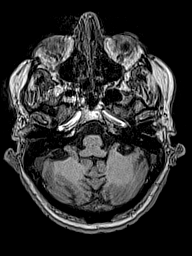
[im 60/160]
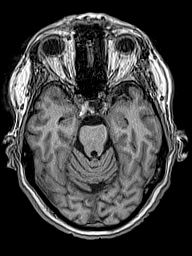
[im 80/160]
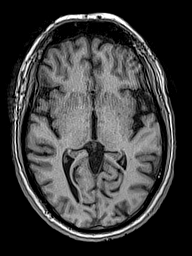
[im 100/160]
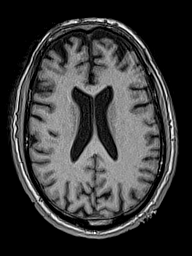
[im 120/160]
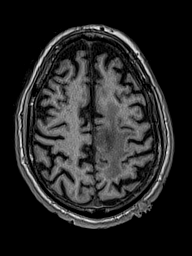
[im 140/160]
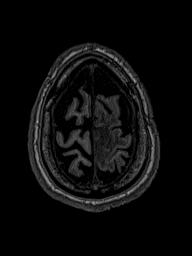
[im 160/160]
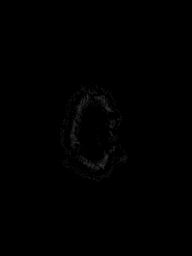

[Series 13: cor dwi_tracew · coronal · 5.0mm · 1.53mm/px · 4 of 64 slices shown]
[im 1/64]
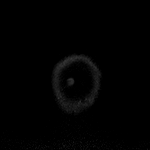
[im 22/64]
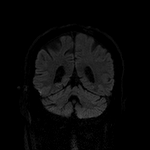
[im 43/64]
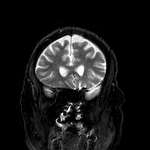
[im 64/64]
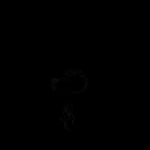

[Series 14: cor dwi_adc · coronal · 5.0mm · 1.53mm/px · 2 of 32 slices shown]
[im 1/32]
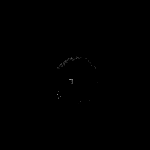
[im 32/32]
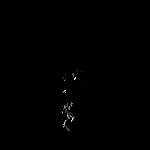

[Series 15: T2 post-contrast · coronal · 5.0mm · 0.57mm/px · 2 of 32 slices shown]
[im 1/32]
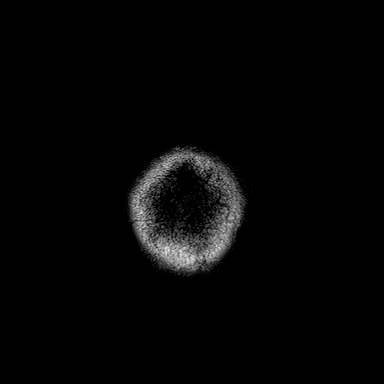
[im 32/32]
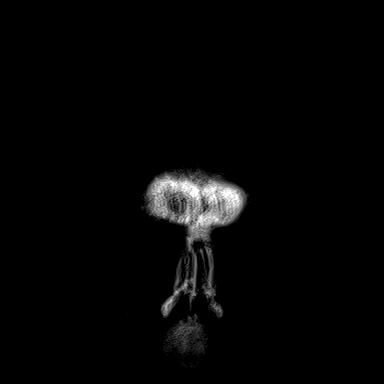

[Series 16: T1 post-contrast · axial · 1.0mm · 0.94mm/px · z∈[-38,+115]mm · 9 of 160 slices shown (1 of 3)]
[im 1/160]
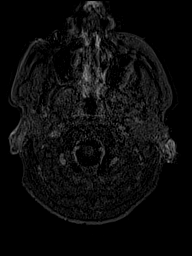
[im 20/160]
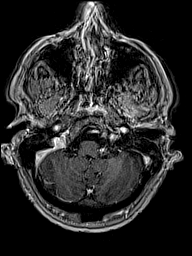
[im 40/160]
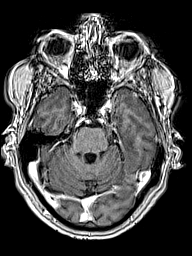
[im 60/160]
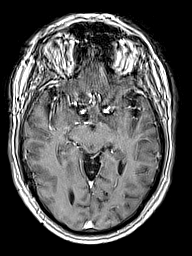
[im 80/160]
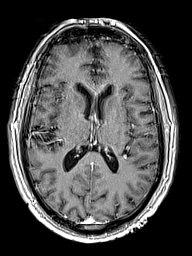
[im 100/160]
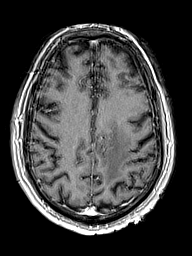
[im 120/160]
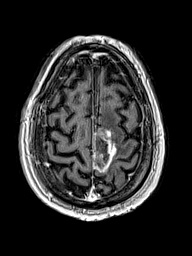
[im 140/160]
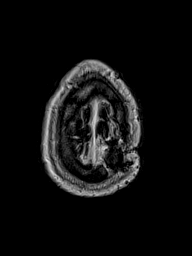
[im 160/160]
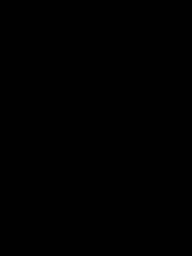

[Series 17: T1 post-contrast · coronal · 5.0mm · 0.43mm/px · 2 of 32 slices shown (2 of 3)]
[im 1/32]
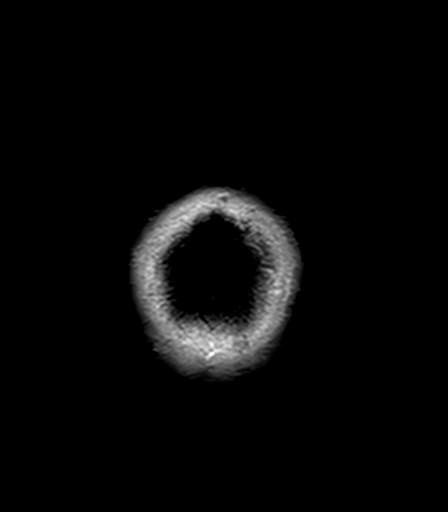
[im 32/32]
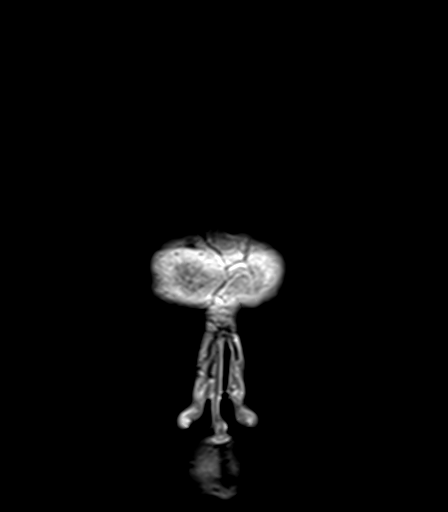

[Series 18: T1 post-contrast · sagittal · 5.0mm · 0.75mm/px · 1 of 26 slices shown (3 of 3)]
[im 1/26]
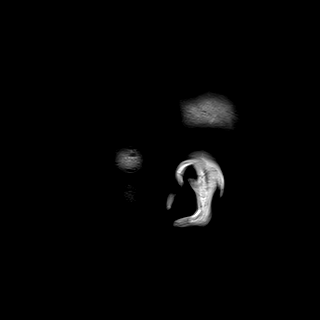

[48 of 48 positions shown; findings below may reference images not displayed]

FINDINGS: Brain: Superior left parasagittal masslike T2 and FLAIR signal
heterogeneity with irregular postcontrast enhancement extends from
the superior sagittal sinus to the left posterior cingulate gyrus.
Following contrast the lesion now encompasses 38 by 22 x 40 mm (AP
by transverse by CC) versus 32 by 19 x 35 mm (AP by transverse by
CC). Regional T2 and FLAIR hyperintensity is largely stable,
although increased in the lower left parietal lobe as seen on series
11, image 37). Mild regional mass effect has not significantly
changed. Involvement in the posterior body of the left corpus
callosum appears stable on series 15, image 14. Diffusion
heterogeneity within the lesion has mildly increased., as has
hemosiderin (series 9, image 45).

Overlying craniotomy. No other abnormal intracranial enhancement or
chronic cerebral blood products. No superimposed restricted
diffusion suggestive of acute infarction. No midline shift,
ventriculomegaly, extra-axial collection or acute intracranial
hemorrhage. Cervicomedullary junction and pituitary are within
normal limits.

Vascular: Major intracranial vascular flow voids are stable, with
preserved enhancement of the major dural venous sinuses.

Skull and upper cervical spine: Sequelae of craniotomy, otherwise
negative.

Sinuses/Orbits: Stable, negative.

Other: Stable left posterior convexity scalp irregularity.
IMPRESSION: 1. Increased size of the enhancing left parasagittal mass since
[DATE] x 22 x 40 mm now versus 32 x 19 x 35 mm previously),
along with some increased hemosiderin within the mass. Favor true
Progression. Modestly increased T2/FLAIR signal in the lower left
parietal lobe. No significant intracranial mass effect.

2. No new intracranial abnormality.

## 2021-06-07 MED ORDER — GADOBUTROL 1 MMOL/ML IV SOLN
8.0000 mL | Freq: Once | INTRAVENOUS | Status: AC | PRN
  Administered 2021-06-07: 8 mL via INTRAVENOUS

## 2021-06-07 MED ORDER — DIVALPROEX SODIUM 500 MG PO DR TAB: 500.0000 mg | DELAYED_RELEASE_TABLET | Freq: Two times a day (BID) | ORAL | 3 refills | Status: DC

## 2021-06-07 MED ORDER — LEVETIRACETAM 750 MG PO TABS: 1500.0000 mg | ORAL_TABLET | Freq: Two times a day (BID) | ORAL | 1 refills | Status: DC

## 2021-06-07 NOTE — Telephone Encounter (Signed)
Patient called he requests refill for Depakote and Keppra to [facility] in [place]. (Please change location for Keppra to go to [facility] in [place] instead of [facility].)  Routed to MD.

## 2021-07-16 ENCOUNTER — Telehealth: Payer: Self-pay | Admitting: *Deleted

## 2021-07-16 NOTE — Telephone Encounter (Signed)
Dr Mickeal Skinner emailed [facility] again and received prompt response from Ardeen Fillers, FNP-C that stated that images would be forwarded to Dr Brett Albino and will get back to [place] ASAP.  Attempted to call patients brother Elta Guadeloupe [phone] to give him an update. Left message pending call back.

## 2021-07-16 NOTE — Telephone Encounter (Signed)
Patients sister called stating that they still haven't heard anything from [place].  The patient is declining and is now unable to use his right arm.  She wanted to get an appointment for the patient to be seen.  Routing to MD to advise if that is the best next action since case was referred out to [facility].

## 2021-07-19 ENCOUNTER — Telehealth: Payer: Self-pay

## 2021-07-19 ENCOUNTER — Other Ambulatory Visit: Payer: Self-pay | Admitting: Internal Medicine

## 2021-07-19 ENCOUNTER — Other Ambulatory Visit: Payer: Self-pay

## 2021-07-19 MED ORDER — DEXAMETHASONE 4 MG PO TABS: 4.0000 mg | ORAL_TABLET | Freq: Two times a day (BID) | ORAL | 1 refills | Status: DC

## 2021-07-19 NOTE — Telephone Encounter (Signed)
Received a call from the patient's father Elta Guadeloupe stating that the patient is having seizures more frequently and his speech has decreased to where he can only respond yes and no. Elta Guadeloupe is unaware if the patient is still taking Decadron or if he has any on hand. He did confirm that he is taking the seizure medication. The patient has an MRI and follow up appointment at [facility] tomorrow. After speaking with Dr. Mickeal Skinner called the patient father back and explained that Dr. Mickeal Skinner has sent in a prescription for Decadron that needs to be started today. Also explained that once Dr. Mickeal Skinner receives the information from tomorrow's visit he will follow up with the family regarding the next steps in the process. Elta Guadeloupe verbalized understanding and had no other questions or concerns.

## 2021-07-23 ENCOUNTER — Ambulatory Visit
Admission: RE | Admit: 2021-07-23 | Discharge: 2021-07-23 | Disposition: A | Discharge status: Home / Self Care | Payer: Self-pay | Source: Ambulatory Visit | Attending: Internal Medicine | Admitting: Internal Medicine

## 2021-07-23 ENCOUNTER — Inpatient Hospital Stay: Payer: Medicaid Other | Attending: Internal Medicine | Admitting: Internal Medicine

## 2021-07-23 ENCOUNTER — Other Ambulatory Visit: Payer: Self-pay | Admitting: *Deleted

## 2021-07-23 ENCOUNTER — Other Ambulatory Visit: Payer: Self-pay

## 2021-07-23 ENCOUNTER — Encounter: Payer: Self-pay | Admitting: Internal Medicine

## 2021-07-23 VITALS — BP 127/83 | HR 89 | Temp 98.1°F | Resp 17 | Ht 76.0 in | Wt 183.9 lb

## 2021-07-23 DIAGNOSIS — R569 Unspecified convulsions: Secondary | ICD-10-CM | POA: Diagnosis not present

## 2021-07-23 DIAGNOSIS — Z79899 Other long term (current) drug therapy: Secondary | ICD-10-CM | POA: Diagnosis not present

## 2021-07-23 DIAGNOSIS — C711 Malignant neoplasm of frontal lobe: Secondary | ICD-10-CM

## 2021-07-23 DIAGNOSIS — Z803 Family history of malignant neoplasm of breast: Secondary | ICD-10-CM | POA: Diagnosis not present

## 2021-07-23 DIAGNOSIS — R531 Weakness: Secondary | ICD-10-CM | POA: Insufficient documentation

## 2021-07-23 DIAGNOSIS — Z7952 Long term (current) use of systemic steroids: Secondary | ICD-10-CM | POA: Diagnosis not present

## 2021-07-23 DIAGNOSIS — R609 Edema, unspecified: Secondary | ICD-10-CM | POA: Insufficient documentation

## 2021-07-23 MED ORDER — DIVALPROEX SODIUM 500 MG PO DR TAB: 500.0000 mg | DELAYED_RELEASE_TABLET | Freq: Two times a day (BID) | ORAL | 3 refills | Status: DC

## 2021-07-23 MED ORDER — LEVETIRACETAM 750 MG PO TABS: 1500.0000 mg | ORAL_TABLET | Freq: Two times a day (BID) | ORAL | 1 refills | Status: DC

## 2021-07-23 NOTE — Progress Notes (Signed)
[facility] [place], [place] [address] [phone]   Interval Evaluation  Date of Service: 07/23/21 Patient Name: Leonard [name] Patient MRN: [id number] Patient DOB: 21-Aug-1965 Provider: [name], MD  Identifying Statement:  Leonard [name] is a 55 y.o. male with left frontal glioblastoma   Oncologic History: 06/15/20: Undergoes craniotomy, resection at [facility]; path demonstrates glioblastoma 07/25/20: Completes 3 weeks hypofractionated radiation with Temozolomide ([facility]) 09/29/20: Initiates therapy with 5-day Temozolomide 04/16/21: Progression of disease, referral placed for LITT vs re-resection at [facility] 07/19/21: After lost to follow up, further clinical and radiographic progression  Biomarkers:  MGMT Unknown.  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  EGFR Unknown  TERT Unknown   Interval History:  Leonard [name] presents today for follow up after having been lost to follow up for 2 months.  He and his brother describe significant decline in strength on the right side (arm and leg) over the past few weeks.  They also describe impaired in expression and comprehension of language.  However, this has clearly improved since decadron was started on 07/19/21.  He is now reliant on family/help for most ADLs.  Continues on Keppra and Depakote as prior, did experience several breakthrough seizures prior to resumption of decadron.  They are having a very difficult time getting him out of the house to appointments, especially with large travel distance.  H+P (09/22/20) Patient presents today on behalf of Dr. Hinton Rao due to recent progression of tumor.  He describes ongoing right leg weakness, requiring use of a walker; this has not apparently worsened significantly in recent weeks.  He also acknowledges some clumsiness and dysfunction affecting the right arm, although subtle.  Denies fatigue, memory issues.  No recent seizures, he has been  compliant with Keppra and Depakote.  Vimpat was discontinued because of expense issues.  Otherwise denies new or progressive neurologic deficits.  Medications: Current Outpatient Medications on File Prior to Visit  Medication Sig Dispense Refill   dexamethasone (DECADRON) 4 MG tablet Take 1 tablet (4 mg total) by mouth 2 (two) times daily. 60 tablet 1   levOCARNitine (CARNITOR) 330 MG tablet Take 2 tablets (660 mg total) by mouth 2 (two) times daily. 120 tablet 5   No current facility-administered medications on file prior to visit.    Allergies: No Known Allergies Past Medical History: No past medical history on file. Past Surgical History:  Social History:  Social History   Socioeconomic History   Marital status: Single    Spouse name: Not on file   Number of children: Not on file   Years of education: Not on file   Highest education level: Not on file  Occupational History   Not on file  Tobacco Use   Smoking status: Never   Smokeless tobacco: Never  Substance and Sexual Activity   Alcohol use: Never   Drug use: Never   Sexual activity: Not Currently  Other Topics Concern   Not on file  Social History Narrative   Not on file   Social Determinants of Health   Financial Resource Strain: Not on file  Food Insecurity: Not on file  Transportation Needs: Not on file  Physical Activity: Not on file  Stress: Not on file  Social Connections: Not on file  Intimate Partner Violence: Not on file   Family History:  Family History  Problem Relation Age of Onset   Breast cancer Sister     Review of Systems:  Constitutional: Doesn't report fevers, chills or abnormal weight loss Eyes: Doesn't report blurriness of vision Ears, nose, mouth, throat, and face: Doesn't report sore throat Respiratory: Doesn't report cough, dyspnea or wheezes Cardiovascular: Doesn't report palpitation, chest discomfort  Gastrointestinal:  Doesn't report nausea, constipation, diarrhea GU: Doesn't  report incontinence Skin: Doesn't report skin rashes Neurological: Per HPI Musculoskeletal: Doesn't report joint pain Behavioral/Psych: Doesn't report anxiety  Physical Exam: Vitals:   07/23/21 1455  BP: 127/83  Pulse: 89  Resp: 17  Temp: 98.1 F (36.7 C)  SpO2: 98%    KPS: 60. General: Alert, cooperative, pleasant, in no acute distress Head: Normal EENT: No conjunctival injection or scleral icterus.  Lungs: Resp effort normal Cardiac: Regular rate Abdomen: Non-distended abdomen Skin: No rashes cyanosis or petechiae. Extremities: No clubbing or edema  Neurologic Exam: Mental Status: Awake, alert, attentive to examiner. Oriented to self and environment. Mixed dysphasia  Cranial Nerves: Visual acuity is grossly normal. Visual fields are full. Extra-ocular movements intact. No ptosis. Face is symmetric Motor: Tone and bulk are normal. Power is 1/5 in right leg, 1/5 in right arm. Reflexes are symmetric, no pathologic reflexes present.  Sensory: Intact to light touch Gait: Non ambulatory   Labs: I have reviewed the data as listed    Component Value Date/Time   NA 143 04/16/2021 0829   NA 142 08/17/2020 0000   K 4.1 04/16/2021 0829   CL 108 04/16/2021 0829   CO2 25 04/16/2021 0829   GLUCOSE 85 04/16/2021 0829   BUN 18 04/16/2021 0829   BUN 16 08/17/2020 0000   CREATININE 1.00 04/16/2021 0829   CALCIUM 9.8 04/16/2021 0829   PROT 6.8 04/16/2021 0829   ALBUMIN 3.8 04/16/2021 0829   AST 9 (L) 04/16/2021 0829   ALT 14 04/16/2021 0829   ALKPHOS 43 04/16/2021 0829   BILITOT 0.3 04/16/2021 0829   GFRNONAA >60 04/16/2021 0829   Lab Results  Component Value Date   WBC 8.1 04/16/2021   NEUTROABS 3.8 04/16/2021   HGB 15.1 04/16/2021   HCT 44.7 04/16/2021   MCV 97.0 04/16/2021   PLT 168 04/16/2021   Imaging:  [place] Clinician Interpretation: I have personally reviewed the CNS images as listed.  My interpretation, in the context of the patient's clinical presentation, is  progressive disease  External images from [facility] reviewed (07/20/21), demonstrate progression of disease and significant peritumoral edema.   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  We are encouraged to see him again after longer than expected absence; unfortunately [facility] neurosurgery team did not deem him candidate for re-resection or LITT after their initial review supported this pathway.    We again reviewed treatment plan pathways moving forward for progressive glioblastoma.  Because of degree of edema, burden of enhancing disease, and dense clinical symptoms, he is good candidate for avastin at this time.  Would plan to administer 4 cycles at 51m/kg IV q2 weeks, with MRI re-eval following.  We are hopeful that Avastin can be infused with Dr. [place]i [place]team [place].    Would defer formal cytotoxic or salvage chemotherapy now because of poor functional status.     Informed consent was obtained verbally at bedside to proceed with avastin.  Avastin should be held for the following:  ANC less than 500  Platelets less than 50,000  LFT or creatinine greater than 2x ULN  If clinical concerns/contraindications develop  Decadron  should stay at 29m BID until avastin is initiated.    We will also make arrangements for home nursing, PT, OT and speech.  Should continue Keppra 15052mBID, Depakote can decrease to 50029mID.  Leonard [name] will return via phone call in 2 weeks for continued re-evaluation, or sooner in person [place]. The patient knows to call the clinic with any problems, questions or concerns. No barriers to learning were detected.  The total time spent in the encounter was 40 minutes and more than 50% was on counseling and review of test results   Leonard SellersD Medical Director of Neuro-Oncology [facility]/19/22 4:05 PM

## 2021-07-24 ENCOUNTER — Telehealth: Payer: Self-pay | Admitting: *Deleted

## 2021-07-24 ENCOUNTER — Telehealth: Payer: Self-pay | Admitting: Internal Medicine

## 2021-07-24 NOTE — Telephone Encounter (Signed)
[facility] will accept patient for Home Health care services on 08/03/21 for Nursing, Nurse Aide, PT, OT, Speech and SW.

## 2021-07-24 NOTE — Telephone Encounter (Signed)
Faxed referral information to [phone]

## 2021-07-24 NOTE — Telephone Encounter (Signed)
Scheduled per 12/19 los, pt brother has been called and confirmed phone appt

## 2021-07-27 ENCOUNTER — Other Ambulatory Visit: Payer: Self-pay | Admitting: Oncology

## 2021-07-27 DIAGNOSIS — C711 Malignant neoplasm of frontal lobe: Secondary | ICD-10-CM

## 2021-07-31 ENCOUNTER — Encounter: Payer: Self-pay | Admitting: Oncology

## 2021-08-01 ENCOUNTER — Encounter: Payer: Self-pay | Admitting: Oncology

## 2021-08-01 NOTE — Progress Notes (Signed)
[place]  [phone] [phone]  Patient Care Team: Patient, No Pcp Per (Inactive) as PCP - General (General Practice) [name], MD as Consulting Physician (Oncology) [name], MD as Consulting Physician (Neurology)   Name of the patient: Leonard [name]  [id number]  1966/04/29   Date of visit: 08/01/21  Diagnosis- Gliobastoma  Chief complaint/Reason for visit- Initial Meeting for [facility], preparing for starting chemotherapy   Heme/Onc history:  Oncology History  Frontal glioblastoma ([place])  06/15/2020 Initial Diagnosis   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[place])   06/15/2020 Cancer Staging   Staging form: Brain and Spinal Cord, AJCC 8th Edition - Pathologic stage from 06/15/2020: WHO Grade IV - Signed by [name], MD on 09/30/2020 Histopathologic type: Glioblastoma Stage prefix: Initial diagnosis Histologic grading system: 4 grade system Extent of surgical resection: Complete (gross) resection Solitary (s) or multifocal (m) tumors in the primary site: Solitary Seizures at presentation: Present Duration of symptoms before diagnosis: Short    09/29/2020 - 09/29/2020 Chemotherapy   Patient is on Treatment Plan : BRAIN GLIOBLASTOMA Consolidation Temozolomide Days 1-5 q28 Days      08/03/2021 -  Chemotherapy   Patient is on Treatment Plan : BRAIN GBM Bevacizumab 14d x 6 cycles       Interval history-  Patient presents to chemo care clinic today for initial meeting in preparation for starting chemotherapy. I introduced the chemo care clinic and we discussed that the role of the clinic is to assist those who are at an increased risk of emergency room visits and/or complications during the course of chemotherapy treatment. We discussed that the increased risk takes into account factors such as age, performance status, and co-morbidities. We also discussed that for  some, this might include barriers to care such as not having a primary care provider, lack of insurance/transportation, or not being able to afford medications. We discussed that the goal of the program is to help prevent unplanned ER visits and help reduce complications during chemotherapy. We do this by discussing specific risk factors to each individual and identifying ways that we can help improve these risk factors and reduce barriers to care.   No Known Allergies  No past medical history on file.    Social History   Socioeconomic History   Marital status: Single    Spouse name: Not on file   Number of children: Not on file   Years of education: Not on file   Highest education level: Not on file  Occupational History   Not on file  Tobacco Use   Smoking status: Never   Smokeless tobacco: Never  Substance and Sexual Activity   Alcohol use: Never   Drug use: Never   Sexual activity: Not Currently  Other Topics Concern   Not on file  Social History Narrative   Not on file   Social Determinants of Health   Financial Resource Strain: Not on file  Food Insecurity: Not on file  Transportation Needs: Not on file  Physical Activity: Not on file  Stress: Not on file  Social Connections: Not on file  Intimate Partner Violence: Not on file    Family History  Problem Relation Age of Onset   Breast cancer Sister      Current Outpatient Medications:    dexamethasone (DECADRON) 4 MG tablet, Take 1 tablet (4 mg total) by mouth 2 (two) times daily., Disp: 60 tablet, Rfl: 1  divalproex (DEPAKOTE) 500 MG DR tablet, Take 1 tablet (500 mg total) by mouth 2 (two) times daily., Disp: 60 tablet, Rfl: 3   levETIRAcetam (KEPPRA) 750 MG tablet, Take 2 tablets (1,500 mg total) by mouth 2 (two) times daily., Disp: 120 tablet, Rfl: 1   levOCARNitine (CARNITOR) 330 MG tablet, Take 2 tablets (660 mg total) by mouth 2 (two) times daily., Disp: 120 tablet, Rfl: 5  CMP Latest Ref Rng & Units  04/16/2021  Glucose 70 - 99 mg/dL 85  BUN 6 - 20 mg/dL 18  Creatinine 0.61 - 1.24 mg/dL 1.00  Sodium 135 - 145 mmol/L 143  Potassium 3.5 - 5.1 mmol/L 4.1  Chloride 98 - 111 mmol/L 108  CO2 22 - 32 mmol/L 25  Calcium 8.9 - 10.3 mg/dL 9.8  Total Protein 6.5 - 8.1 g/dL 6.8  Total Bilirubin 0.3 - 1.2 mg/dL 0.3  Alkaline Phos 38 - 126 U/L 43  AST 15 - 41 U/L 9(L)  ALT 0 - 44 U/L 14   CBC Latest Ref Rng & Units 04/16/2021  WBC 4.0 - 10.5 K/uL 8.1  Hemoglobin 13.0 - 17.0 g/dL 15.1  Hematocrit 39.0 - 52.0 % 44.7  Platelets 150 - 400 K/uL 168    No images are attached to the encounter.  No results found.   Assessment and plan- Patient is a 55 y.o. male who presents to [facility] for initial meeting in preparation for starting chemotherapy for the treatment of glioblastoma.   Chemo Care Clinic/High Risk for ER/Hospitalization during chemotherapy- We discussed the role of the chemo care clinic and identified patient specific risk factors. I discussed that patient was identified as high risk primarily based on:  Patient has past medical history positive for: No past medical history on file.  Patient has past surgical history positive for:    Provided general information including the following: 1.  Date of education: 08/02/2021 2.  Physician name: Dr. Hinton Rao 3.  Diagnosis: Gliobastoma 4.  Stage: WHO grade IV 5.  Palliative 6.  Chemotherapy plan including drugs and how often: Bevacizumab IV every 2 weeks 7.  Start date: 08/03/2021 8.  Other referrals: None at this time 9.  The patient is to call our office with any questions or concerns.  Our office number [phone], if after hours or on the weekend, call the same number and wait for the answering service.  There is always an oncologist on call 10.  Medications prescribed: Ondansetron/ Prochlorperazine 11.  The patient has verbalized understanding of the treatment plan and has no barriers to adherence or  understanding.  Obtained signed consent from patient.  Discussed symptoms including 1.  Low blood counts including red blood cells, white blood cells and platelets. 2. Infection including to avoid large crowds, wash hands frequently, and stay away from people who were sick.  If fever develops of 100.4 or higher, call our office. 3.  Mucositis-given instructions on mouth rinse (baking soda and salt mixture).  Keep mouth clean.  Use soft bristle toothbrush.  If mouth sores develop, call our clinic. 4.  Nausea/vomiting-gave prescriptions for ondansetron 4 mg every 4 hours as needed for nausea, may take around the clock if persistent.  Compazine 10 mg every 6 hours, may take around the clock if persistent. 5.  Diarrhea-use over-the-counter Imodium.  Call clinic if not controlled. 6.  Constipation-use senna, 1 to 2 tablets twice a day.  If no BM in 2 to 3 days call the clinic. 7.  Loss  of appetite-try to eat small meals every 2-3 hours.  Call clinic if not eating. 8.  Taste changes-zinc 500 mg daily.  If becomes severe call clinic. 9.  Alcoholic beverages. 10.  Drink 2 to 3 quarts of water per day. 11.  Peripheral neuropathy-patient to call if numbness or tingling in hands or feet is persistent   Answered questions to patient satisfaction.  Patient is to call with any further questions or concerns.  The medication prescribed to the patient will be printed out from [email or web address] This will give the following information: Name of your medication Approved uses Dose and schedule Storage and handling Handling body fluids and waste Drug and food interactions Possible side effects and management Pregnancy, sexual activity, and contraception Obtaining medication  We discussed that social determinants of health may have significant impacts on health and outcomes for cancer patients.  Today we discussed specific social determinants of performance status, alcohol use, depression, financial  needs, food insecurity, housing, interpersonal violence, social connections, stress, tobacco use, and transportation.    After lengthy discussion the following were identified as areas of need:   Outpatient services: We discussed options including home based and outpatient services, DME and care program. We discusssed that patients who participate in regular physical activity report fewer negative impacts of cancer and treatments and report less fatigue.   Financial Concerns: We discussed that living with cancer can create tremendous financial burden.  We discussed options for assistance. I asked that if assistance is needed in affording medications or paying bills to please let us know so that we can provide assistance. We will also notify Mort Sawyers to see if cancer center can provide additional support.  Referral to Social work: Introduced Education officer, museum Mort Sawyers and the services he can provide such as support with utility bill, cell phone and gas vouchers.   Support groups: We discussed options for support groups at the cancer center. If interested, please notify nurse navigator to enroll. We discussed options for managing stress including healthy eating, exercise as well as participating in no charge counseling services at the cancer center and support groups.  If these are of interest, patient can notify either myself or primary nursing team.We discussed options for management including medications and referral to quit Smart program  Transportation: We discussed options for transportation.  I have notified primary oncology team who will help assist with arranging Lucianne Lei transportation for appointments when/if needed. We also discussed options for transportation on short notice/acute visits.  Palliative care services: We have palliative care services available in the cancer center to discuss goals of care and advanced care planning.  Please let us know if you have any questions or would like  to speak to our palliative nurse practitioner.  Symptom Management Clinic: We discussed our symptom management clinic which is available for acute concerns while receiving treatment such as nausea, vomiting or diarrhea.  We can be reached via telephone at [phone] or through my chart.  We are available for virtual or in person visits on the same day from 830 to 4 PM Monday through Friday. He denies needing specific assistance at this time and He will be followed by Dr. Hinton Rao clinical team.  Plan: Discussed symptom management clinic. Discussed palliative care services. Discussed resources that are available here at the cancer center. Discussed medications and new prescriptions to begin treatment such as anti-nausea or steroids.   Disposition: RTC on   Visit Diagnosis No diagnosis found.  Patient expressed understanding and was  in agreement with this plan. He also understands that He can call clinic at any time with any questions, concerns, or complaints.   I provided 30 minutes of  face to face  during this encounter, and > 50% was spent counseling as documented under my assessment & plan.   Altha Harm, PhD, NP-C [place] at [facility] [phone]

## 2021-08-02 ENCOUNTER — Encounter: Payer: Self-pay | Admitting: Hematology and Oncology

## 2021-08-02 ENCOUNTER — Inpatient Hospital Stay (INDEPENDENT_AMBULATORY_CARE_PROVIDER_SITE_OTHER): Payer: Medicaid Other | Admitting: Hematology and Oncology

## 2021-08-02 ENCOUNTER — Inpatient Hospital Stay: Payer: Medicaid Other

## 2021-08-02 VITALS — BP 130/85 | HR 100 | Temp 98.4°F | Resp 18 | Ht 76.0 in | Wt 183.7 lb

## 2021-08-02 DIAGNOSIS — C711 Malignant neoplasm of frontal lobe: Secondary | ICD-10-CM | POA: Diagnosis not present

## 2021-08-02 LAB — CBC AND DIFFERENTIAL
HCT: 47 (ref 41–53)
Hemoglobin: 16 (ref 13.5–17.5)
Neutrophils Absolute: 12.07
Platelets: 314 (ref 150–399)
WBC: 14.2

## 2021-08-02 LAB — HEPATIC FUNCTION PANEL
ALT: 27 (ref 10–40)
AST: 18 (ref 14–40)
Alkaline Phosphatase: 62 (ref 25–125)
Bilirubin, Total: 0.5

## 2021-08-02 LAB — COMPREHENSIVE METABOLIC PANEL
Albumin: 3.9 (ref 3.5–5.0)
Calcium: 9.8 (ref 8.7–10.7)

## 2021-08-02 LAB — BASIC METABOLIC PANEL
BUN: 19 (ref 4–21)
CO2: 32 — AB (ref 13–22)
Chloride: 104 (ref 99–108)
Creatinine: 0.7 (ref 0.6–1.3)
Glucose: 126
Potassium: 4.2 (ref 3.4–5.3)
Sodium: 139 (ref 137–147)

## 2021-08-02 LAB — CBC: RBC: 4.78 (ref 3.87–5.11)

## 2021-08-02 LAB — TOTAL PROTEIN, URINE DIPSTICK: Protein, ur: NEGATIVE mg/dL

## 2021-08-03 ENCOUNTER — Inpatient Hospital Stay: Payer: Medicaid Other

## 2021-08-03 ENCOUNTER — Other Ambulatory Visit: Payer: Self-pay

## 2021-08-03 VITALS — BP 130/74 | HR 91 | Temp 98.5°F | Resp 18 | Ht 76.0 in | Wt 184.0 lb

## 2021-08-03 DIAGNOSIS — C711 Malignant neoplasm of frontal lobe: Secondary | ICD-10-CM | POA: Diagnosis not present

## 2021-08-03 MED ORDER — SODIUM CHLORIDE 0.9 % IV SOLN
10.0000 mg/kg | Freq: Once | INTRAVENOUS | Status: AC
  Administered 2021-08-03: 800 mg via INTRAVENOUS
  Filled 2021-08-03: qty 32

## 2021-08-03 MED ORDER — SODIUM CHLORIDE 0.9 % IV SOLN: Freq: Once | INTRAVENOUS | Status: AC

## 2021-08-03 NOTE — Patient Instructions (Signed)
Bevacizumab injection What is this medication? BEVACIZUMAB (be va SIZ yoo mab) is a monoclonal antibody. It is used to treat many types of cancer. This medicine may be used for other purposes; ask your health care provider or pharmacist if you have questions. COMMON BRAND NAME(S): Alymsys, Avastin, MVASI, Noah Charon What should I tell my care team before I take this medication? They need to know if you have any of these conditions: diabetes heart disease high blood pressure history of coughing up blood prior anthracycline chemotherapy (e.g., doxorubicin, daunorubicin, epirubicin) recent or ongoing radiation therapy recent or planning to have surgery stroke an unusual or allergic reaction to bevacizumab, hamster proteins, mouse proteins, other medicines, foods, dyes, or preservatives pregnant or trying to get pregnant breast-feeding How should I use this medication? This medicine is for infusion into a vein. It is given by a health care professional in a hospital or clinic setting. Talk to your pediatrician regarding the use of this medicine in children. Special care may be needed. Overdosage: If you think you have taken too much of this medicine contact a poison control center or emergency room at once. NOTE: This medicine is only for you. Do not share this medicine with others. What if I miss a dose? It is important not to miss your dose. Call your doctor or health care professional if you are unable to keep an appointment. What may interact with this medication? Interactions are not expected. This list may not describe all possible interactions. Give your health care provider a list of all the medicines, herbs, non-prescription drugs, or dietary supplements you use. Also tell them if you smoke, drink alcohol, or use illegal drugs. Some items may interact with your medicine. What should I watch for while using this medication? Your condition will be monitored carefully while you are  receiving this medicine. You will need important blood work and urine testing done while you are taking this medicine. This medicine may increase your risk to bruise or bleed. Call your doctor or health care professional if you notice any unusual bleeding. Before having surgery, talk to your health care provider to make sure it is ok. This drug can increase the risk of poor healing of your surgical site or wound. You will need to stop this drug for 28 days before surgery. After surgery, wait at least 28 days before restarting this drug. Make sure the surgical site or wound is healed enough before restarting this drug. Talk to your health care provider if questions. Do not become pregnant while taking this medicine or for 6 months after stopping it. Women should inform their doctor if they wish to become pregnant or think they might be pregnant. There is a potential for serious side effects to an unborn child. Talk to your health care professional or pharmacist for more information. Do not breast-feed an infant while taking this medicine and for 6 months after the last dose. This medicine has caused ovarian failure in some women. This medicine may interfere with the ability to have a child. You should talk to your doctor or health care professional if you are concerned about your fertility. What side effects may I notice from receiving this medication? Side effects that you should report to your doctor or health care professional as soon as possible: allergic reactions like skin rash, itching or hives, swelling of the face, lips, or tongue chest pain or chest tightness chills coughing up blood high fever seizures severe constipation signs and symptoms of bleeding  such as bloody or black, tarry stools; red or dark-brown urine; spitting up blood or brown material that looks like coffee grounds; red spots on the skin; unusual bruising or bleeding from the eye, gums, or nose signs and symptoms of a blood  clot such as breathing problems; chest pain; severe, sudden headache; pain, swelling, warmth in the leg signs and symptoms of a stroke like changes in vision; confusion; trouble speaking or understanding; severe headaches; sudden numbness or weakness of the face, arm or leg; trouble walking; dizziness; loss of balance or coordination stomach pain sweating swelling of legs or ankles vomiting weight gain Side effects that usually do not require medical attention (report to your doctor or health care professional if they continue or are bothersome): back pain changes in taste decreased appetite dry skin nausea tiredness This list may not describe all possible side effects. Call your doctor for medical advice about side effects. You may report side effects to FDA at 1-800-FDA-1088. Where should I keep my medication? This drug is given in a hospital or clinic and will not be stored at home. NOTE: This sheet is a summary. It may not cover all possible information. If you have questions about this medicine, talk to your doctor, pharmacist, or health care provider.  2022 Elsevier/Gold Standard (2021-04-10 00:00:00)

## 2021-08-07 ENCOUNTER — Telehealth: Payer: Self-pay

## 2021-08-07 NOTE — Telephone Encounter (Signed)
I spoke with pt. He is doing good. Pt excited that he has regained strength in his right hand. Pt denies N/V, diarrhea, headaches, fevers, mouth sores, & rashes. I reminded pt to call us if he develops temp of 100.4 or higher, DAY OR NIGHT. Pt verbalized understanding & confirmed next appt as well.

## 2021-08-09 ENCOUNTER — Encounter: Payer: Self-pay | Admitting: Oncology

## 2021-08-09 ENCOUNTER — Inpatient Hospital Stay: Payer: Medicaid Other | Attending: Internal Medicine | Admitting: Internal Medicine

## 2021-08-09 ENCOUNTER — Ambulatory Visit: Payer: Medicaid Other | Admitting: Internal Medicine

## 2021-08-09 DIAGNOSIS — R600 Localized edema: Secondary | ICD-10-CM | POA: Insufficient documentation

## 2021-08-09 DIAGNOSIS — Z923 Personal history of irradiation: Secondary | ICD-10-CM | POA: Insufficient documentation

## 2021-08-09 DIAGNOSIS — Z7952 Long term (current) use of systemic steroids: Secondary | ICD-10-CM | POA: Insufficient documentation

## 2021-08-09 DIAGNOSIS — C711 Malignant neoplasm of frontal lobe: Secondary | ICD-10-CM

## 2021-08-09 DIAGNOSIS — Z79899 Other long term (current) drug therapy: Secondary | ICD-10-CM | POA: Insufficient documentation

## 2021-08-09 DIAGNOSIS — R569 Unspecified convulsions: Secondary | ICD-10-CM | POA: Diagnosis not present

## 2021-08-09 DIAGNOSIS — Z5112 Encounter for antineoplastic immunotherapy: Secondary | ICD-10-CM | POA: Insufficient documentation

## 2021-08-09 MED ORDER — DEXAMETHASONE 4 MG PO TABS: 4.0000 mg | ORAL_TABLET | Freq: Every day | ORAL | 1 refills | Status: DC

## 2021-08-09 NOTE — Progress Notes (Signed)
I connected with Leonard [name] on 08/09/21 at 11:00 AM EST by telephone visit and verified that I am speaking with the correct person using two identifiers.  I discussed the limitations, risks, security and privacy concerns of performing an evaluation and management service by telemedicine and the availability of in-person appointments. I also discussed with the patient that there may be a patient responsible charge related to this service. The patient expressed understanding and agreed to proceed.  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  He describes improved strength in his right side, able to use right side for more daily activities (although it's still weak).  Energy feels better as well.  Continues on decadron 4mg  twice per day as prior. Observations: Language and cognition at baseline Assessment and Plan: 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[facility]) - Plan: MR BRAIN W WO CONTRAST  Frontal glioblastoma ([place])  Seizures ([place])  Clinically improved following initiation of avastin.  Recommended decreasing decadron to 4mg  daily.  Follow Up Instructions: RTC in February following 4th cycle of avastin, MRI brain.  Will call again in 2 weeks to continue decadron adjustment, taper.  I discussed the assessment and treatment plan with the patient.  The patient was provided an opportunity to ask questions and all were answered.  The patient agreed with the plan and demonstrated understanding of the instructions.    The patient was advised to call back or seek an in-person evaluation if the symptoms worsen or if the condition fails to  improve as anticipated.  I provided 5-10 minutes of non-face-to-face time during this enocunter.  [name], MD   I provided 15 minutes of non face-to-face telephone visit time during this encounter, and > 50% was spent counseling as documented under my assessment & plan.

## 2021-08-10 ENCOUNTER — Telehealth: Payer: Self-pay | Admitting: Internal Medicine

## 2021-08-10 NOTE — Telephone Encounter (Signed)
Scheduled follow-up appointment per 1/5 los. Patient is aware. °

## 2021-08-15 ENCOUNTER — Other Ambulatory Visit: Payer: Self-pay

## 2021-08-15 ENCOUNTER — Other Ambulatory Visit: Payer: Self-pay | Admitting: Hematology and Oncology

## 2021-08-15 ENCOUNTER — Inpatient Hospital Stay: Payer: Medicaid Other

## 2021-08-15 DIAGNOSIS — C711 Malignant neoplasm of frontal lobe: Secondary | ICD-10-CM | POA: Diagnosis present

## 2021-08-15 DIAGNOSIS — R569 Unspecified convulsions: Secondary | ICD-10-CM | POA: Diagnosis not present

## 2021-08-15 DIAGNOSIS — Z79899 Other long term (current) drug therapy: Secondary | ICD-10-CM | POA: Diagnosis not present

## 2021-08-15 DIAGNOSIS — Z7952 Long term (current) use of systemic steroids: Secondary | ICD-10-CM | POA: Diagnosis not present

## 2021-08-15 DIAGNOSIS — Z5112 Encounter for antineoplastic immunotherapy: Secondary | ICD-10-CM | POA: Diagnosis present

## 2021-08-15 DIAGNOSIS — R600 Localized edema: Secondary | ICD-10-CM | POA: Diagnosis not present

## 2021-08-15 DIAGNOSIS — Z923 Personal history of irradiation: Secondary | ICD-10-CM | POA: Diagnosis not present

## 2021-08-15 LAB — CBC AND DIFFERENTIAL
HCT: 46 (ref 41–53)
Hemoglobin: 15.4 (ref 13.5–17.5)
Neutrophils Absolute: 8.38
Platelets: 143 — AB (ref 150–399)
WBC: 10.1

## 2021-08-15 LAB — CBC: RBC: 4.65 (ref 3.87–5.11)

## 2021-08-15 LAB — TOTAL PROTEIN, URINE DIPSTICK: Protein, ur: NEGATIVE mg/dL

## 2021-08-15 MED ORDER — ALBUTEROL SULFATE HFA 108 (90 BASE) MCG/ACT IN AERS: 2.0000 | INHALATION_SPRAY | Freq: Four times a day (QID) | RESPIRATORY_TRACT | 2 refills | Status: AC | PRN

## 2021-08-15 MED ORDER — MAGIC MOUTHWASH W/LIDOCAINE: 5.0000 mL | Freq: Four times a day (QID) | ORAL | 3 refills | Status: AC | PRN

## 2021-08-16 MED FILL — Bevacizumab-bvzr IV Soln 400 MG/16ML (For Infusion): INTRAVENOUS | Qty: 32 | Status: AC

## 2021-08-16 NOTE — Assessment & Plan Note (Addendum)
Grade 4 left frontal glioblastoma.  We know he had two surgeries in November, the first to attempt biopsy and the second for resection.  He started radiation with concurrent temozolomide on December 2nd, and completed 15 fractions of radiation therapy on December 21st.  He now has rapid recurrence with a 2.9 cm lesion in the left posterior frontal lobe. He started bevacizumab and tolerated the first cycle well. He will proceed with cycle 2 today. He will return to clinic in 2 weeks for repeat evaluation.

## 2021-08-16 NOTE — Progress Notes (Signed)
Patient Care Team: Patient, No Pcp Per (Inactive) as PCP - General (General Practice) [name], MD as Consulting Physician (Oncology) [name], MD as Consulting Physician (Neurology)  Clinic Day:  08/17/2021  Referring physician: [name], MD  ASSESSMENT & PLAN:   Assessment & Plan: Frontal glioblastoma ([place]) Grade 4 left frontal glioblastoma.  We know he had two surgeries in November, the first to attempt biopsy and the second for resection.  He started radiation with concurrent temozolomide on December 2nd, and completed 15 fractions of radiation therapy on December 21st.  He now has rapid recurrence with a 2.9 cm lesion in the left posterior frontal lobe. He started bevacizumab and tolerated the first cycle well. He will proceed with cycle 2 today. He will return to clinic in 2 weeks for repeat evaluation.   Edema New onset right lower extremity edema with 2+ pitting. No redness, warmth or pain. We will assess with ultrasound today.    The patient understands the plans discussed today and [name] in agreement with them.  He knows to contact our office if he develops concerns prior to his next appointment.    [name], NP  [facility] [address] [place] [address] Dept: [phone] Dept Fax: [phone]   No orders of the defined types were placed in this encounter.     CHIEF COMPLAINT:  CC: A 56 year old male with history of glioblastoma here for 2 week evaluation prior to cycle 2 chemotherapy  Current Treatment:  Bevacizumab IV every 2 weeks  INTERVAL HISTORY:  Leonard [name] here today for repeat clinical assessment. He denies fevers or chills. He denies pain. His appetite [name] good. His weight has been stable. He has some new edema to his right leg and we will assess with ultrasound today.   I have reviewed the past medical history, past surgical history, social  history and family history with the patient and they are unchanged from previous note.  ALLERGIES:  has No Known Allergies.  MEDICATIONS:  Current Outpatient Medications  Medication Sig Dispense Refill   albuterol (VENTOLIN HFA) 108 (90 Base) MCG/ACT inhaler Inhale 2 puffs into the lungs every 6 (six) hours as needed for wheezing or shortness of breath. 8 g 2   dexamethasone (DECADRON) 4 MG tablet Take 1 tablet (4 mg total) by mouth daily. 60 tablet 1   divalproex (DEPAKOTE) 500 MG DR tablet Take 1 tablet (500 mg total) by mouth 2 (two) times daily. 60 tablet 3   levETIRAcetam (KEPPRA) 750 MG tablet Take 2 tablets (1,500 mg total) by mouth 2 (two) times daily. 120 tablet 1   magic mouthwash w/lidocaine SOLN Take 5 mLs by mouth 4 (four) times daily as needed for mouth pain. 240 mL 3   No current facility-administered medications for this visit.    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[place])   06/15/2020 Cancer Staging   Staging form: Brain and Spinal Cord, AJCC 8th Edition - Pathologic stage from 06/15/2020: WHO Grade IV - Signed by [name], MD on 09/30/2020 Histopathologic type: Glioblastoma Stage prefix: Initial diagnosis Histologic grading system: 4 grade system Extent of surgical resection: Complete (gross) resection Solitary (s) or multifocal (m) tumors in the primary site: Solitary Seizures at presentation: Present Duration of symptoms before diagnosis: Short    09/29/2020 - 09/29/2020  Chemotherapy   Patient [name] on Treatment Plan : BRAIN GLIOBLASTOMA Consolidation Temozolomide Days 1-5 q28 Days      08/03/2021 -  Chemotherapy   Patient [name] on Treatment Plan : BRAIN GBM Bevacizumab 14d x 6 cycles         REVIEW OF SYSTEMS:   Constitutional: Denies fevers, chills or abnormal weight loss Eyes: Denies blurriness of vision Ears, nose, mouth, throat, and  face: Denies mucositis or sore throat Respiratory: Denies cough, dyspnea or wheezes Cardiovascular: Denies palpitation, chest discomfort or lower extremity swelling Gastrointestinal:  Denies nausea, heartburn or change in bowel habits Skin: Denies abnormal skin rashes Lymphatics: Denies new lymphadenopathy or easy bruising Neurological:Denies numbness, tingling or new weaknesses Behavioral/Psych: Mood [name] stable, no new changes  All other systems were reviewed with the patient and are negative.   VITALS:  There were no vitals taken for this visit.  Wt Readings from Last 3 Encounters:  08/17/21 191 lb (86.6 kg)  08/03/21 184 lb (83.5 kg)  08/02/21 183 lb 11.2 oz (83.3 kg)    There [name] no height or weight on file to calculate BMI.  Performance status (ECOG): 1 - Symptomatic but completely ambulatory  PHYSICAL EXAM:   GENERAL:alert, no distress and comfortable SKIN: skin color, texture, turgor are normal, no rashes or significant lesions EYES: normal, Conjunctiva are pink and non-injected, sclera clear OROPHARYNX:no exudate, no erythema and lips, buccal mucosa, and tongue normal  NECK: supple, thyroid normal size, non-tender, without nodularity LYMPH:  no palpable lymphadenopathy in the cervical, axillary or inguinal LUNGS: clear to auscultation and percussion with normal breathing effort HEART: regular rate & rhythm and no murmurs and right lower extremity edema ABDOMEN:abdomen soft, non-tender and normal bowel sounds Musculoskeletal:no cyanosis of digits and no clubbing  NEURO: alert & oriented x 3 with fluent speech, no focal motor/sensory deficits  LABORATORY DATA:  I have reviewed the data as listed    Component Value Date/Time   NA 139 08/02/2021 0000   K 4.2 08/02/2021 0000   CL 104 08/02/2021 0000   CO2 32 (A) 08/02/2021 0000   GLUCOSE 85 04/16/2021 0829   BUN 19 08/02/2021 0000   CREATININE 0.7 08/02/2021 0000   CREATININE 1.00 04/16/2021 0829   CALCIUM 9.8  08/02/2021 0000   PROT 6.8 04/16/2021 0829   ALBUMIN 3.9 08/02/2021 0000   AST 18 08/02/2021 0000   AST 9 (L) 04/16/2021 0829   ALT 27 08/02/2021 0000   ALT 14 04/16/2021 0829   ALKPHOS 62 08/02/2021 0000   BILITOT 0.3 04/16/2021 0829   GFRNONAA >60 04/16/2021 0829    No results found for: SPEP, UPEP  Lab Results  Component Value Date   WBC 10.1 08/15/2021   NEUTROABS 8.38 08/15/2021   HGB 15.4 08/15/2021   HCT 46 08/15/2021   MCV 97.0 04/16/2021   PLT 143 (A) 08/15/2021      Chemistry      Component Value Date/Time   NA 139 08/02/2021 0000   K 4.2 08/02/2021 0000   CL 104 08/02/2021 0000   CO2 32 (A) 08/02/2021 0000   BUN 19 08/02/2021 0000   CREATININE 0.7 08/02/2021 0000   CREATININE 1.00 04/16/2021 0829   GLU 126 08/02/2021 0000      Component Value Date/Time   CALCIUM 9.8 08/02/2021 0000   ALKPHOS 62 08/02/2021 0000   AST 18 08/02/2021 0000   AST 9 (L) 04/16/2021 0829   ALT 27 08/02/2021 0000   ALT 14 04/16/2021 0829  BILITOT 0.3 04/16/2021 0829       RADIOGRAPHIC STUDIES: I have personally reviewed the radiological images as listed and agreed with the findings in the report. No results found.

## 2021-08-17 ENCOUNTER — Inpatient Hospital Stay (INDEPENDENT_AMBULATORY_CARE_PROVIDER_SITE_OTHER): Payer: Medicaid Other | Admitting: Hematology and Oncology

## 2021-08-17 ENCOUNTER — Inpatient Hospital Stay: Payer: Medicaid Other

## 2021-08-17 ENCOUNTER — Telehealth: Payer: Self-pay | Admitting: Hematology and Oncology

## 2021-08-17 ENCOUNTER — Other Ambulatory Visit: Payer: Self-pay

## 2021-08-17 ENCOUNTER — Encounter: Payer: Self-pay | Admitting: Hematology and Oncology

## 2021-08-17 VITALS — BP 142/91 | HR 106 | Temp 97.9°F | Resp 18 | Ht 76.0 in | Wt 191.0 lb

## 2021-08-17 DIAGNOSIS — C711 Malignant neoplasm of frontal lobe: Secondary | ICD-10-CM

## 2021-08-17 DIAGNOSIS — Z5112 Encounter for antineoplastic immunotherapy: Secondary | ICD-10-CM | POA: Diagnosis not present

## 2021-08-17 DIAGNOSIS — R609 Edema, unspecified: Secondary | ICD-10-CM | POA: Insufficient documentation

## 2021-08-17 MED ORDER — SODIUM CHLORIDE 0.9 % IV SOLN: Freq: Once | INTRAVENOUS | Status: AC

## 2021-08-17 MED ORDER — SODIUM CHLORIDE 0.9 % IV SOLN
10.0000 mg/kg | Freq: Once | INTRAVENOUS | Status: AC
  Administered 2021-08-17: 800 mg via INTRAVENOUS
  Filled 2021-08-17: qty 32

## 2021-08-17 NOTE — Patient Instructions (Signed)
Bevacizumab injection What is this medication? BEVACIZUMAB (be va SIZ yoo mab) is a monoclonal antibody. It is used to treat many types of cancer. This medicine may be used for other purposes; ask your health care provider or pharmacist if you have questions. COMMON BRAND NAME(S): Alymsys, Avastin, MVASI, Noah Charon What should I tell my care team before I take this medication? They need to know if you have any of these conditions: diabetes heart disease high blood pressure history of coughing up blood prior anthracycline chemotherapy (e.g., doxorubicin, daunorubicin, epirubicin) recent or ongoing radiation therapy recent or planning to have surgery stroke an unusual or allergic reaction to bevacizumab, hamster proteins, mouse proteins, other medicines, foods, dyes, or preservatives pregnant or trying to get pregnant breast-feeding How should I use this medication? This medicine is for infusion into a vein. It is given by a health care professional in a hospital or clinic setting. Talk to your pediatrician regarding the use of this medicine in children. Special care may be needed. Overdosage: If you think you have taken too much of this medicine contact a poison control center or emergency room at once. NOTE: This medicine is only for you. Do not share this medicine with others. What if I miss a dose? It is important not to miss your dose. Call your doctor or health care professional if you are unable to keep an appointment. What may interact with this medication? Interactions are not expected. This list may not describe all possible interactions. Give your health care provider a list of all the medicines, herbs, non-prescription drugs, or dietary supplements you use. Also tell them if you smoke, drink alcohol, or use illegal drugs. Some items may interact with your medicine. What should I watch for while using this medication? Your condition will be monitored carefully while you are  receiving this medicine. You will need important blood work and urine testing done while you are taking this medicine. This medicine may increase your risk to bruise or bleed. Call your doctor or health care professional if you notice any unusual bleeding. Before having surgery, talk to your health care provider to make sure it is ok. This drug can increase the risk of poor healing of your surgical site or wound. You will need to stop this drug for 28 days before surgery. After surgery, wait at least 28 days before restarting this drug. Make sure the surgical site or wound is healed enough before restarting this drug. Talk to your health care provider if questions. Do not become pregnant while taking this medicine or for 6 months after stopping it. Women should inform their doctor if they wish to become pregnant or think they might be pregnant. There is a potential for serious side effects to an unborn child. Talk to your health care professional or pharmacist for more information. Do not breast-feed an infant while taking this medicine and for 6 months after the last dose. This medicine has caused ovarian failure in some women. This medicine may interfere with the ability to have a child. You should talk to your doctor or health care professional if you are concerned about your fertility. What side effects may I notice from receiving this medication? Side effects that you should report to your doctor or health care professional as soon as possible: allergic reactions like skin rash, itching or hives, swelling of the face, lips, or tongue chest pain or chest tightness chills coughing up blood high fever seizures severe constipation signs and symptoms of bleeding  such as bloody or black, tarry stools; red or dark-brown urine; spitting up blood or brown material that looks like coffee grounds; red spots on the skin; unusual bruising or bleeding from the eye, gums, or nose signs and symptoms of a blood  clot such as breathing problems; chest pain; severe, sudden headache; pain, swelling, warmth in the leg signs and symptoms of a stroke like changes in vision; confusion; trouble speaking or understanding; severe headaches; sudden numbness or weakness of the face, arm or leg; trouble walking; dizziness; loss of balance or coordination stomach pain sweating swelling of legs or ankles vomiting weight gain Side effects that usually do not require medical attention (report to your doctor or health care professional if they continue or are bothersome): back pain changes in taste decreased appetite dry skin nausea tiredness This list may not describe all possible side effects. Call your doctor for medical advice about side effects. You may report side effects to FDA at 1-800-FDA-1088. Where should I keep my medication? This drug is given in a hospital or clinic and will not be stored at home. NOTE: This sheet is a summary. It may not cover all possible information. If you have questions about this medicine, talk to your doctor, pharmacist, or health care provider.  2022 Elsevier/Gold Standard (2021-04-10 00:00:00)

## 2021-08-17 NOTE — Telephone Encounter (Signed)
Unable to obtain signature/co-pay due to [facility] seeing patient at [facility] t

## 2021-08-17 NOTE — Assessment & Plan Note (Signed)
New onset right lower extremity edema with 2+ pitting. No redness, warmth or pain. We will assess with ultrasound today.

## 2021-08-20 ENCOUNTER — Telehealth: Payer: Self-pay | Admitting: *Deleted

## 2021-08-20 NOTE — Telephone Encounter (Signed)
Records request received from [facility] for history and physical, lab reports and daily progress notes be submitted for dates 01/26/2021 thru 04/28/2021.    Faxed records to [phone]

## 2021-08-21 ENCOUNTER — Telehealth: Payer: Self-pay

## 2021-08-21 ENCOUNTER — Other Ambulatory Visit: Payer: Self-pay | Admitting: Hematology and Oncology

## 2021-08-21 MED ORDER — CEPHALEXIN 500 MG PO CAPS: 500.0000 mg | ORAL_CAPSULE | Freq: Four times a day (QID) | ORAL | 0 refills | Status: DC

## 2021-08-21 NOTE — Telephone Encounter (Signed)
Pt called to ask for an antibiotic for his right foot/heel. He states, "my heel is red and there are 2 places on it, that look they may come to a head. My shoe smells like infection". Pt denies any open areas on his foot/heel. Afebrile. He does still have edema in right lower ext, that does go down some when he elevates it.  Next appt here is Feb.   I sent the above message to [facility] P,NP.

## 2021-08-21 NOTE — Telephone Encounter (Signed)
-----   Message from [name], NP sent at 08/21/2021  3:41 PM EST ----- Regarding: RE: Pt req antibiotic for right heel/foot Contact: [phone] Sent Keflex  ----- Message ----- From: Dairl Ponder, RN Sent: 08/21/2021   3:23 PM EST To: [name], NP Subject: Pt req antibiotic for right heel/foot          Pt called to ask for an antibiotic for his right foot/heel. He states, "my heel is red and there are 2 places on it, that look they may come to a head. My shoe smells like infection". Pt denies any open areas on his foot/heel. Afebrile. He does still have edema in right lower ext, that does go down some when he elevates it. Next f/u here is in Feb.

## 2021-08-27 ENCOUNTER — Inpatient Hospital Stay (HOSPITAL_BASED_OUTPATIENT_CLINIC_OR_DEPARTMENT_OTHER): Payer: Medicaid Other | Admitting: Internal Medicine

## 2021-08-27 DIAGNOSIS — R569 Unspecified convulsions: Secondary | ICD-10-CM

## 2021-08-27 DIAGNOSIS — C711 Malignant neoplasm of frontal lobe: Secondary | ICD-10-CM

## 2021-08-27 MED ORDER — DEXAMETHASONE 4 MG PO TABS: 2.0000 mg | ORAL_TABLET | Freq: Every day | ORAL | 1 refills | Status: DC

## 2021-08-27 NOTE — Progress Notes (Signed)
I connected with Leonard [name] on 08/27/21 at 10:30 AM EST by telephone visit and verified that I am speaking with the correct person using two identifiers.  I discussed the limitations, risks, security and privacy concerns of performing an evaluation and management service by telemedicine and the availability of in-person appointments. I also discussed with the patient that there may be a patient responsible charge related to this service. The patient expressed understanding and agreed to proceed.  Other persons participating in the visit and their role in the encounter:  brouth   Patient's location:  Home  Provider's location:  Office   Chief Complaint:  Frontal glioblastoma ([place])  Seizures ([place])  History of Present Ilness: Leonard [name] describes continued stability or improvement in right sided weakness, now having completed 2 avastin infusions.  He does describe swollen and possibly weeping aspect of his right foot.  [facility] team started Keflex and checked LE ultrasond. Continues on decadron 4mg  daily as discussed.  Observations: Language and cognition at baseline  Assessment and Plan: Frontal glioblastoma (H[facility])  Seizures ([facility])  Clinically stable today.  LE issues may partly related to skin breakdown and poor healing from combination decadron and avastin.  Recommended decreasing decadron to 2mg  daily x1 week, then 2mg  QOD thereafter if tolerated.    [facility] team will con't to follow, of course.  Follow Up Instructions: RTC in person in February following 4th cycle of avastin, MRI brain.  Will call again in 2 weeks to continue decadron adjustment, taper.  I discussed the assessment and treatment plan with the patient.  The patient was provided an opportunity to ask questions and all were answered.  The patient agreed with the plan and demonstrated understanding of the instructions.    The patient was advised to call back or seek an in-person evaluation if the symptoms  worsen or if the condition fails to improve as anticipated.  I provided 5-10 minutes of non-face-to-face time during this enocunter.  [name], MD  I provided 22 minutes of non face-to-face telephone visit time during this encounter, and > 50% was spent counseling as documented under my assessment & plan.

## 2021-08-28 ENCOUNTER — Encounter: Payer: Self-pay | Admitting: Hematology and Oncology

## 2021-09-04 ENCOUNTER — Telehealth: Payer: Self-pay

## 2021-09-04 NOTE — Telephone Encounter (Addendum)
Pt given appt for tomorrow w/Melissa.  RE: Ultrasound results Received: Leonard [name], Leonard Bruins, NP  Dairl Ponder, RN I did talk to him myself. I haven't seen him since that day in infusion. It was nothing like that before. It looks like he is following with [facility] as well. I know he has difficulty with transportation. We may need to try to get him in.        Previous Messages   ----- Message -----  From: Dairl Ponder, RN  Sent: 09/04/2021   4:45 PM EST  To: [name], NP  Subject: RE: Ultrasound results                         He states no one talked to him about it. He also wanted to let us know he finished the antibiotic but he still has some swelling in his right foot and ankle w/draining. He keeps towel around it to keep liquid from getting into his shoe. He still has periods of shooting pain at times at ankle bone.    [name],RN: Pt notified of below.  ----- Message from [name], NP sent at 09/04/2021  4:23 PM EST ----- Regarding: RE: Ultrasound results We did an ultrasound earlier in the month and it was negative. He may not remember [place] discussing it.  ----- Message ----- From: Dairl Ponder, RN Sent: 09/04/2021   4:20 PM EST To: [name], NP Subject: Ultrasound results                             Pt calling asking for results from ultrasound? [phone]

## 2021-09-05 ENCOUNTER — Encounter: Payer: Self-pay | Admitting: Oncology

## 2021-09-06 ENCOUNTER — Other Ambulatory Visit: Payer: Self-pay | Admitting: Hematology and Oncology

## 2021-09-06 ENCOUNTER — Other Ambulatory Visit: Payer: Self-pay

## 2021-09-06 ENCOUNTER — Telehealth: Payer: Self-pay | Admitting: Internal Medicine

## 2021-09-06 ENCOUNTER — Inpatient Hospital Stay: Payer: Medicaid Other | Attending: Internal Medicine | Admitting: Hematology and Oncology

## 2021-09-06 ENCOUNTER — Inpatient Hospital Stay: Payer: Medicaid Other

## 2021-09-06 ENCOUNTER — Encounter: Payer: Self-pay | Admitting: Hematology and Oncology

## 2021-09-06 DIAGNOSIS — Z5112 Encounter for antineoplastic immunotherapy: Secondary | ICD-10-CM | POA: Insufficient documentation

## 2021-09-06 DIAGNOSIS — R531 Weakness: Secondary | ICD-10-CM | POA: Diagnosis not present

## 2021-09-06 DIAGNOSIS — C711 Malignant neoplasm of frontal lobe: Secondary | ICD-10-CM

## 2021-09-06 DIAGNOSIS — Z803 Family history of malignant neoplasm of breast: Secondary | ICD-10-CM | POA: Diagnosis not present

## 2021-09-06 DIAGNOSIS — Z79899 Other long term (current) drug therapy: Secondary | ICD-10-CM | POA: Insufficient documentation

## 2021-09-06 DIAGNOSIS — R600 Localized edema: Secondary | ICD-10-CM | POA: Insufficient documentation

## 2021-09-06 DIAGNOSIS — R609 Edema, unspecified: Secondary | ICD-10-CM | POA: Diagnosis not present

## 2021-09-06 LAB — HEPATIC FUNCTION PANEL
ALT: 22 (ref 10–40)
AST: 19 (ref 14–40)
Alkaline Phosphatase: 51 (ref 25–125)
Bilirubin, Total: 0.4

## 2021-09-06 LAB — CBC AND DIFFERENTIAL
HCT: 43 (ref 41–53)
Hemoglobin: 14.8 (ref 13.5–17.5)
Neutrophils Absolute: 7.45
Platelets: 235 (ref 150–399)
WBC: 10.2

## 2021-09-06 LAB — BASIC METABOLIC PANEL
BUN: 17 (ref 4–21)
CO2: 26 — AB (ref 13–22)
Chloride: 112 — AB (ref 99–108)
Creatinine: 0.7 (ref 0.6–1.3)
Glucose: 130
Potassium: 4 (ref 3.4–5.3)
Sodium: 143 (ref 137–147)

## 2021-09-06 LAB — CBC
MCV: 97 — AB (ref 80–94)
RBC: 4.41 (ref 3.87–5.11)

## 2021-09-06 LAB — COMPREHENSIVE METABOLIC PANEL
Albumin: 3.8 (ref 3.5–5.0)
Calcium: 9 (ref 8.7–10.7)

## 2021-09-06 LAB — TOTAL PROTEIN, URINE DIPSTICK: Protein, ur: NEGATIVE mg/dL

## 2021-09-06 MED ORDER — CEPHALEXIN 500 MG PO CAPS: 500.0000 mg | ORAL_CAPSULE | Freq: Three times a day (TID) | ORAL | 0 refills | Status: DC

## 2021-09-06 MED ORDER — FUROSEMIDE 20 MG PO TABS: 20.0000 mg | ORAL_TABLET | Freq: Every day | ORAL | 0 refills | Status: DC

## 2021-09-06 NOTE — Progress Notes (Addendum)
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  We know he had two surgeries in November, the first to attempt biopsy and the second for resection.  He started radiation with concurrent temozolomide on December 2nd, and completed 15 fractions of radiation therapy on December 21st.  He now has rapid recurrence with a 2.9 cm lesion in the left posterior frontal lobe. He started bevacizumab and tolerated the first cycle well. He will proceed with next schedule in one week.    Edema Worsening right lower extremity edema with 4+ pitting. Ultrasound was negative. Weeping areas to foot noted from swelling with multiple scabbed over areas. Dr. Sherrilee Gilles aware and attributes to decreased healing due to dexamethasone and bevacizumab. He recommended weaning dexamethasone which he is currently doing. We will repeat a course of antibiotics and try a few days of lasix. He has follow up on 2/6 with Dr. Sherrilee Gilles.   The patient understands the plans discussed today and is in agreement with them.  He knows to contact our office if he develops concerns prior to his next appointment.    [name], NP  [facility] [address] [place] [address] Dept: [phone] Dept Fax: [phone]   No orders of the defined types were placed in this encounter.     CHIEF COMPLAINT:  CC: A 56 year old male with history of glioblastoma here for symptom management due to right lower extremity swelling  Current Treatment:  Bevacizumab  INTERVAL HISTORY:  Candice is here today for repeat clinical assessment. He denies  fevers or chills. He denies pain. His appetite is good. His weight has increased 7 pounds over last 3 weeks .  I have reviewed the past medical history, past surgical history, social history and family history with the patient and they are unchanged from previous note.  ALLERGIES:  has No Known Allergies.  MEDICATIONS:  Current Outpatient Medications  Medication Sig Dispense Refill   cephALEXin (KEFLEX) 500 MG capsule Take 1 capsule (500 mg total) by mouth 3 (three) times daily. 21 capsule 0   furosemide (LASIX) 20 MG tablet Take 1 tablet (20 mg total) by mouth daily. 5 tablet 0   albuterol (VENTOLIN HFA) 108 (90 Base) MCG/ACT inhaler Inhale 2 puffs into the lungs every 6 (six) hours as needed for wheezing or shortness of breath. 8 g 2   dexamethasone (DECADRON) 4 MG tablet Take 0.5 tablets (2 mg total) by mouth daily. 60 tablet 1   divalproex (DEPAKOTE) 500 MG DR tablet Take 1 tablet (500 mg total) by mouth 2 (two) times daily. 60 tablet 3   levETIRAcetam (KEPPRA) 750 MG tablet Take 2 tablets (1,500 mg total) by mouth 2 (two) times daily. 120 tablet 1   magic mouthwash w/lidocaine SOLN Take 5 mLs by mouth 4 (four) times daily as needed for mouth pain. 240 mL 3   No current facility-administered medications for this visit.    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[place])   06/15/2020 Cancer Staging   Staging form: Brain and Spinal Cord, AJCC 8th  Edition - Pathologic stage from 06/15/2020: WHO Grade IV - Signed by [name], MD on 09/30/2020 Histopathologic type: Glioblastoma Stage prefix: Initial diagnosis Histologic grading system: 4 grade system Extent of surgical resection: Complete (gross) resection Solitary (s) or multifocal (m) tumors in the primary site: Solitary Seizures at presentation: Present Duration of symptoms before diagnosis: Short    09/29/2020 -  09/29/2020 Chemotherapy   Patient is on Treatment Plan : BRAIN GLIOBLASTOMA Consolidation Temozolomide Days 1-5 q28 Days      08/03/2021 -  Chemotherapy   Patient is on Treatment Plan : BRAIN GBM Bevacizumab 14d x 6 cycles         REVIEW OF SYSTEMS:   Constitutional: Denies fevers, chills or abnormal weight loss Eyes: Denies blurriness of vision Ears, nose, mouth, throat, and face: Denies mucositis or sore throat Respiratory: Denies cough, dyspnea or wheezes Cardiovascular: Denies palpitation, chest discomfort or lower extremity swelling Gastrointestinal:  Denies nausea, heartburn or change in bowel habits Skin: Denies abnormal skin rashes Lymphatics: Denies new lymphadenopathy or easy bruising Neurological:Denies numbness, tingling or new weaknesses Behavioral/Psych: Mood is stable, no new changes  All other systems were reviewed with the patient and are negative.   VITALS:  Blood pressure 135/88, pulse (!) 105, temperature 98.3 F (36.8 C), temperature source Oral, resp. rate 18, height 6\' 4"  (1.93 m), weight 198 lb 11.2 oz (90.1 kg), SpO2 95 %.  Wt Readings from Last 3 Encounters:  09/06/21 198 lb 11.2 oz (90.1 kg)  08/17/21 191 lb (86.6 kg)  08/03/21 184 lb (83.5 kg)    Body mass index is 24.19 kg/m.  Performance status (ECOG): 2 - Symptomatic, <50% confined to bed  PHYSICAL EXAM:   GENERAL:alert, no distress and comfortable SKIN: skin color, texture, turgor are normal, no rashes or significant lesions EYES: normal, Conjunctiva are pink and non-injected, sclera clear OROPHARYNX:no exudate, no erythema and lips, buccal mucosa, and tongue normal  NECK: supple, thyroid normal size, non-tender, without nodularity LYMPH:  no palpable lymphadenopathy in the cervical, axillary or inguinal LUNGS: clear to auscultation and percussion with normal breathing effort HEART: regular rate & rhythm and no murmurs and no lower extremity edema ABDOMEN:abdomen soft, non-tender and  normal bowel sounds Musculoskeletal:no cyanosis of digits and no clubbing  NEURO: alert & oriented x 3 with fluent speech, no focal motor/sensory deficits  LABORATORY DATA:  I have reviewed the data as listed    Component Value Date/Time   NA 143 09/06/2021 0000   K 4.0 09/06/2021 0000   CL 112 (A) 09/06/2021 0000   CO2 26 (A) 09/06/2021 0000   GLUCOSE 85 04/16/2021 0829   BUN 17 09/06/2021 0000   CREATININE 0.7 09/06/2021 0000   CREATININE 1.00 04/16/2021 0829   CALCIUM 9.0 09/06/2021 0000   PROT 6.8 04/16/2021 0829   ALBUMIN 3.8 09/06/2021 0000   AST 19 09/06/2021 0000   AST 9 (L) 04/16/2021 0829   ALT 22 09/06/2021 0000   ALT 14 04/16/2021 0829   ALKPHOS 51 09/06/2021 0000   BILITOT 0.3 04/16/2021 0829   GFRNONAA >60 04/16/2021 0829    No results found for: SPEP, UPEP  Lab Results  Component Value Date   WBC 10.2 09/06/2021   NEUTROABS 7.45 09/06/2021   HGB 14.8 09/06/2021   HCT 43 09/06/2021   MCV 97 (A) 09/06/2021   PLT 235 09/06/2021      Chemistry      Component Value Date/Time   NA 143 09/06/2021 0000  K 4.0 09/06/2021 0000   CL 112 (A) 09/06/2021 0000   CO2 26 (A) 09/06/2021 0000   BUN 17 09/06/2021 0000   CREATININE 0.7 09/06/2021 0000   CREATININE 1.00 04/16/2021 0829   GLU 130 09/06/2021 0000      Component Value Date/Time   CALCIUM 9.0 09/06/2021 0000   ALKPHOS 51 09/06/2021 0000   AST 19 09/06/2021 0000   AST 9 (L) 04/16/2021 0829   ALT 22 09/06/2021 0000   ALT 14 04/16/2021 0829   BILITOT 0.3 04/16/2021 0829       RADIOGRAPHIC STUDIES: I have personally reviewed the radiological images as listed and agreed with the findings in the report. No results found.

## 2021-09-06 NOTE — Telephone Encounter (Signed)
Scheduled per 01/23 los, patient has been called and notified of upcoming appointments.

## 2021-09-06 NOTE — Assessment & Plan Note (Signed)
Worsening right lower extremity edema with 4+ pitting. Ultrasound was negative. Weeping areas to foot noted from swelling with multiple scabbed over areas. Dr. Sherrilee Gilles aware and attributes to decreased healing due to dexamethasone and bevacizumab. He recommended weaning dexamethasone which he is currently doing. We will repeat a course of antibiotics and try a few days of lasix. He has follow up on 2/6 with Dr. Sherrilee Gilles.

## 2021-09-06 NOTE — Assessment & Plan Note (Signed)
Grade 4 left frontal glioblastoma.  We know he had two surgeries in November, the first to attempt biopsy and the second for resection.  He started radiation with concurrent temozolomide on December 2nd, and completed 15 fractions of radiation therapy on December 21st.  He now has rapid recurrence with a 2.9 cm lesion in the left posterior frontal lobe. He started bevacizumab and tolerated the first cycle well. He will proceed with next schedule in one week.

## 2021-09-10 ENCOUNTER — Inpatient Hospital Stay (HOSPITAL_BASED_OUTPATIENT_CLINIC_OR_DEPARTMENT_OTHER): Payer: Medicaid Other | Admitting: Internal Medicine

## 2021-09-10 ENCOUNTER — Other Ambulatory Visit: Payer: Self-pay | Admitting: Radiation Therapy

## 2021-09-10 DIAGNOSIS — R569 Unspecified convulsions: Secondary | ICD-10-CM

## 2021-09-10 DIAGNOSIS — C711 Malignant neoplasm of frontal lobe: Secondary | ICD-10-CM | POA: Diagnosis not present

## 2021-09-10 NOTE — Progress Notes (Signed)
I connected with Leonard [name] on 09/10/21 at 10:00 AM EST by telephone visit and verified that I am speaking with the correct person using two identifiers.  I discussed the limitations, risks, security and privacy concerns of performing an evaluation and management service by telemedicine and the availability of in-person appointments. I also discussed with the patient that there may be a patient responsible charge related to this service. The patient expressed understanding and agreed to proceed.  Other persons participating in the visit and their role in the encounter:  brouth   Patient's location:  Home  Provider's location:  Office   Chief Complaint:  Frontal glioblastoma ([place])  Seizures ([place])  History of Present Ilness: Leonard [name] describes no changes with regards to right sided weakness, now having completed 2 avastin infusions.  Right foot weeping, swelling improved after antibiotics given in [place].  Continues on decadron 2mg  daily.  Observations: Language and cognition at baseline  Assessment and Plan: Frontal glioblastoma ([facility])  Seizures (Leonard [name])  Again clinically stable.  Plan is for 4th infusion of avastin this Friday, then will obtain brain MRI on 09/21/21.  We will bring him into clinic (in person) the next week to evaluate and discuss tumor board group recommendations.  Decadron should be decreased to 2mg  every other day.  He is agreeable with this plan.   team will con't to follow, of course.  Follow Up Instructions: RTC in person 09/24/21 following MRI study.  I discussed the assessment and treatment plan with the patient.  The patient was provided an opportunity to ask questions and all were answered.  The patient agreed with the plan and demonstrated understanding of the instructions.    The patient was advised to call back or seek an in-person evaluation if the symptoms worsen or if the condition fails to improve as anticipated.  I provided 5-10  minutes of non-face-to-face time during this enocunter.  Leonard Sellers, MD  I provided 22 minutes of non face-to-face telephone visit time during this encounter, and > 50% was spent counseling as documented under my assessment & plan.

## 2021-09-11 ENCOUNTER — Telehealth: Payer: Self-pay | Admitting: Internal Medicine

## 2021-09-11 NOTE — Telephone Encounter (Signed)
Scheduled per 2/6 los, pt has been called and is aware of appt

## 2021-09-12 ENCOUNTER — Encounter: Payer: Self-pay | Admitting: Oncology

## 2021-09-12 ENCOUNTER — Other Ambulatory Visit: Payer: Self-pay

## 2021-09-12 ENCOUNTER — Inpatient Hospital Stay: Payer: Medicaid Other

## 2021-09-12 DIAGNOSIS — C711 Malignant neoplasm of frontal lobe: Secondary | ICD-10-CM

## 2021-09-12 DIAGNOSIS — Z5112 Encounter for antineoplastic immunotherapy: Secondary | ICD-10-CM | POA: Diagnosis not present

## 2021-09-12 LAB — TOTAL PROTEIN, URINE DIPSTICK: Protein, ur: NEGATIVE mg/dL

## 2021-09-13 ENCOUNTER — Telehealth: Payer: Self-pay | Admitting: *Deleted

## 2021-09-13 ENCOUNTER — Encounter: Payer: Self-pay | Admitting: Oncology

## 2021-09-13 MED FILL — Bevacizumab-bvzr IV Soln 400 MG/16ML (For Infusion): INTRAVENOUS | Qty: 32 | Status: AC

## 2021-09-13 NOTE — Progress Notes (Signed)
Leave bevacizumab dose at 800 mg despite weight gain per Dr. Hinton Rao.  Weight gain is likely due to edema.  Will continue to monitor.

## 2021-09-13 NOTE — Telephone Encounter (Signed)
Typed letter for medical necessity for [employer].  Faxing to [phone]

## 2021-09-14 ENCOUNTER — Other Ambulatory Visit: Payer: Self-pay

## 2021-09-14 ENCOUNTER — Inpatient Hospital Stay: Payer: Medicaid Other

## 2021-09-14 VITALS — BP 129/86 | HR 88 | Temp 98.7°F | Resp 18 | Ht 76.0 in | Wt 198.0 lb

## 2021-09-14 DIAGNOSIS — C711 Malignant neoplasm of frontal lobe: Secondary | ICD-10-CM

## 2021-09-14 DIAGNOSIS — Z5112 Encounter for antineoplastic immunotherapy: Secondary | ICD-10-CM | POA: Diagnosis not present

## 2021-09-14 MED ORDER — SODIUM CHLORIDE 0.9% FLUSH: 10.0000 mL | INTRAVENOUS | Status: DC | PRN

## 2021-09-14 MED ORDER — SODIUM CHLORIDE 0.9 % IV SOLN: Freq: Once | INTRAVENOUS | Status: AC

## 2021-09-14 MED ORDER — SODIUM CHLORIDE 0.9 % IV SOLN
10.0000 mg/kg | Freq: Once | INTRAVENOUS | Status: AC
  Administered 2021-09-14: 800 mg via INTRAVENOUS
  Filled 2021-09-14: qty 32

## 2021-09-14 MED ORDER — HEPARIN SOD (PORK) LOCK FLUSH 100 UNIT/ML IV SOLN: 500.0000 [IU] | Freq: Once | INTRAVENOUS | Status: DC | PRN

## 2021-09-14 NOTE — Patient Instructions (Signed)
Bevacizumab injection What is this medication? BEVACIZUMAB (be va SIZ yoo mab) is a monoclonal antibody. It is used to treat many types of cancer. This medicine may be used for other purposes; ask your health care provider or pharmacist if you have questions. COMMON BRAND NAME(S): Alymsys, Avastin, MVASI, Noah Charon What should I tell my care team before I take this medication? They need to know if you have any of these conditions: diabetes heart disease high blood pressure history of coughing up blood prior anthracycline chemotherapy (e.g., doxorubicin, daunorubicin, epirubicin) recent or ongoing radiation therapy recent or planning to have surgery stroke an unusual or allergic reaction to bevacizumab, hamster proteins, mouse proteins, other medicines, foods, dyes, or preservatives pregnant or trying to get pregnant breast-feeding How should I use this medication? This medicine is for infusion into a vein. It is given by a health care professional in a hospital or clinic setting. Talk to your pediatrician regarding the use of this medicine in children. Special care may be needed. Overdosage: If you think you have taken too much of this medicine contact a poison control center or emergency room at once. NOTE: This medicine is only for you. Do not share this medicine with others. What if I miss a dose? It is important not to miss your dose. Call your doctor or health care professional if you are unable to keep an appointment. What may interact with this medication? Interactions are not expected. This list may not describe all possible interactions. Give your health care provider a list of all the medicines, herbs, non-prescription drugs, or dietary supplements you use. Also tell them if you smoke, drink alcohol, or use illegal drugs. Some items may interact with your medicine. What should I watch for while using this medication? Your condition will be monitored carefully while you are  receiving this medicine. You will need important blood work and urine testing done while you are taking this medicine. This medicine may increase your risk to bruise or bleed. Call your doctor or health care professional if you notice any unusual bleeding. Before having surgery, talk to your health care provider to make sure it is ok. This drug can increase the risk of poor healing of your surgical site or wound. You will need to stop this drug for 28 days before surgery. After surgery, wait at least 28 days before restarting this drug. Make sure the surgical site or wound is healed enough before restarting this drug. Talk to your health care provider if questions. Do not become pregnant while taking this medicine or for 6 months after stopping it. Women should inform their doctor if they wish to become pregnant or think they might be pregnant. There is a potential for serious side effects to an unborn child. Talk to your health care professional or pharmacist for more information. Do not breast-feed an infant while taking this medicine and for 6 months after the last dose. This medicine has caused ovarian failure in some women. This medicine may interfere with the ability to have a child. You should talk to your doctor or health care professional if you are concerned about your fertility. What side effects may I notice from receiving this medication? Side effects that you should report to your doctor or health care professional as soon as possible: allergic reactions like skin rash, itching or hives, swelling of the face, lips, or tongue chest pain or chest tightness chills coughing up blood high fever seizures severe constipation signs and symptoms of bleeding  such as bloody or black, tarry stools; red or dark-brown urine; spitting up blood or brown material that looks like coffee grounds; red spots on the skin; unusual bruising or bleeding from the eye, gums, or nose signs and symptoms of a blood  clot such as breathing problems; chest pain; severe, sudden headache; pain, swelling, warmth in the leg signs and symptoms of a stroke like changes in vision; confusion; trouble speaking or understanding; severe headaches; sudden numbness or weakness of the face, arm or leg; trouble walking; dizziness; loss of balance or coordination stomach pain sweating swelling of legs or ankles vomiting weight gain Side effects that usually do not require medical attention (report to your doctor or health care professional if they continue or are bothersome): back pain changes in taste decreased appetite dry skin nausea tiredness This list may not describe all possible side effects. Call your doctor for medical advice about side effects. You may report side effects to FDA at 1-800-FDA-1088. Where should I keep my medication? This drug is given in a hospital or clinic and will not be stored at home. NOTE: This sheet is a summary. It may not cover all possible information. If you have questions about this medicine, talk to your doctor, pharmacist, or health care provider.  2022 Elsevier/Gold Standard (2021-04-10 00:00:00)

## 2021-09-21 ENCOUNTER — Other Ambulatory Visit: Payer: Self-pay

## 2021-09-21 ENCOUNTER — Ambulatory Visit (HOSPITAL_COMMUNITY)
Admission: RE | Admit: 2021-09-21 | Discharge: 2021-09-21 | Disposition: A | Discharge status: Home / Self Care | Payer: Medicaid Other | Source: Ambulatory Visit | Attending: Internal Medicine | Admitting: Internal Medicine

## 2021-09-21 DIAGNOSIS — C711 Malignant neoplasm of frontal lobe: Secondary | ICD-10-CM | POA: Diagnosis present

## 2021-09-21 IMAGING — MR MR HEAD WO/W CM
13 series · 48 of 48 positions shown · IV contrast (gadavist)
Comparison: [DATE].  [DATE].  [DATE].  [DATE].

CLINICAL DATA: Brain/CNS neoplasm, assess treatment response.
Glioblastoma. Restaging.

EXAM:
MRI HEAD WITHOUT AND WITH CONTRAST
TECHNIQUE: Multiplanar, multiecho pulse sequences of the brain and surrounding
structures were obtained without and with intravenous contrast.
CONTRAST:  9mL GADAVIST GADOBUTROL 1 MMOL/ML IV SOLN

[Series 5: DWI · axial · 3.0mm · 1.36mm/px · z∈[-32,+125]mm · 5 of 104 slices shown (1 of 2)]
[im 1/104]
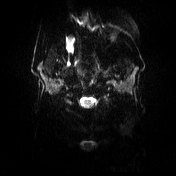
[im 26/104]
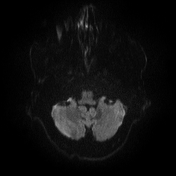
[im 52/104]
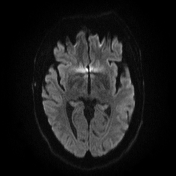
[im 78/104]
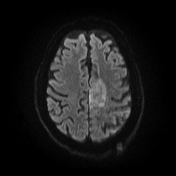
[im 104/104]
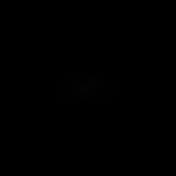

[Series 6: DWI · axial · 3.0mm · 1.36mm/px · z∈[-32,+122]mm · 3 of 55 slices shown (2 of 2)]
[im 1/55]
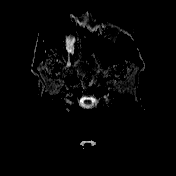
[im 28/55]
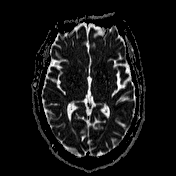
[im 55/55]
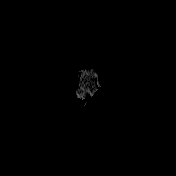

[Series 7: T1 · sagittal · 5.0mm · 0.75mm/px · 2 of 26 slices shown (1 of 2)]
[im 1/26]
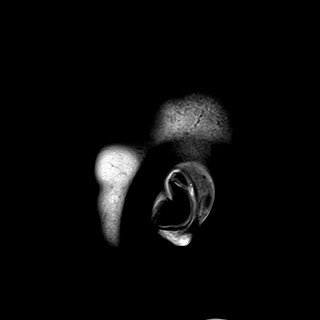
[im 26/26]
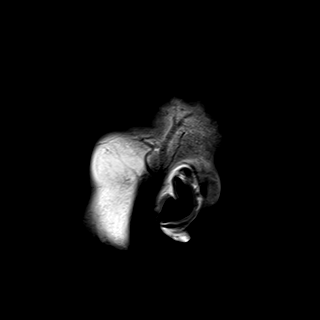

[Series 8: T2 · axial · 5.0mm · 0.62mm/px · z∈[-38,+117]mm · 2 of 26 slices shown]
[im 1/26]
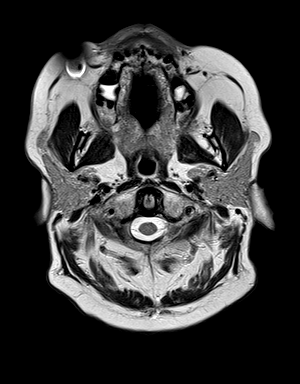
[im 26/26]
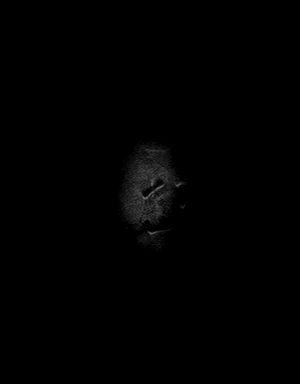

[Series 9: swi_images · axial · 3.0mm · 0.75mm/px · z∈[-40,+118]mm · 3 of 56 slices shown]
[im 1/56]
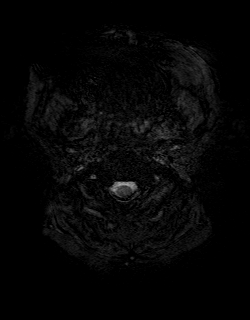
[im 28/56]
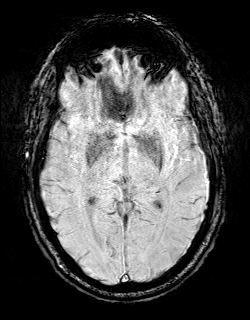
[im 56/56]
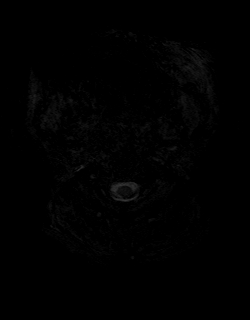

[Series 11: FLAIR · axial · 3.0mm · 0.75mm/px · z∈[-34,+113]mm · 3 of 52 slices shown]
[im 1/52]
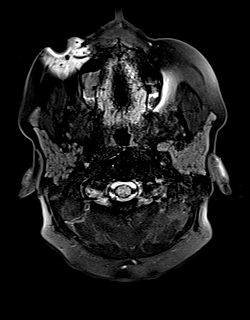
[im 26/52]
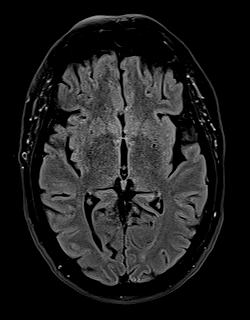
[im 52/52]
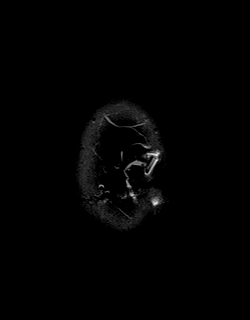

[Series 12: T1 · axial · 1.0mm · 0.94mm/px · z∈[-37,+116]mm · 9 of 157 slices shown (2 of 2)]
[im 1/157]
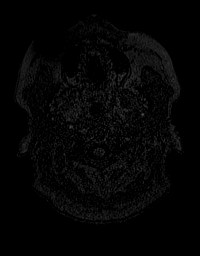
[im 20/157]
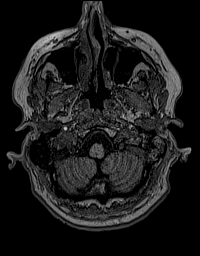
[im 40/157]
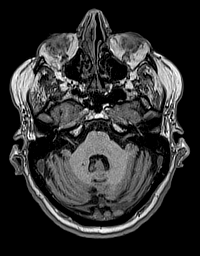
[im 59/157]
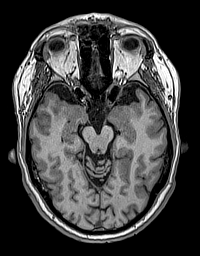
[im 79/157]
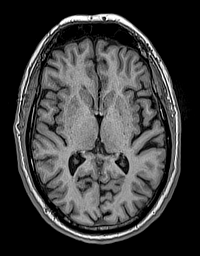
[im 98/157]
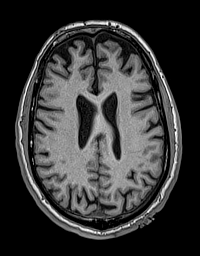
[im 118/157]
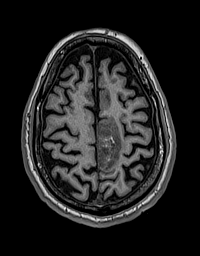
[im 137/157]
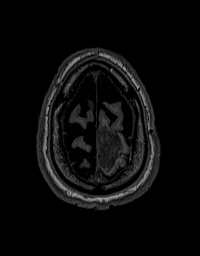
[im 157/157]
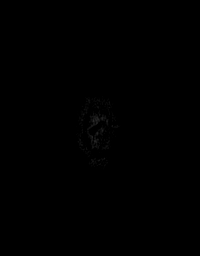

[Series 13: cor dwi_tracew · coronal · 5.0mm · 1.53mm/px · 4 of 64 slices shown]
[im 1/64]
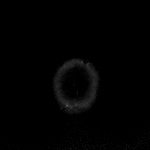
[im 22/64]
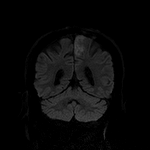
[im 43/64]
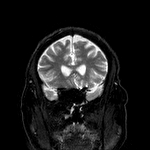
[im 64/64]
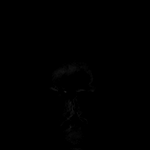

[Series 14: cor dwi_adc · coronal · 5.0mm · 1.53mm/px · 2 of 32 slices shown]
[im 1/32]
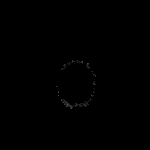
[im 32/32]
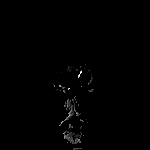

[Series 15: T2 post-contrast · coronal · 5.0mm · 0.57mm/px · 2 of 32 slices shown]
[im 1/32]
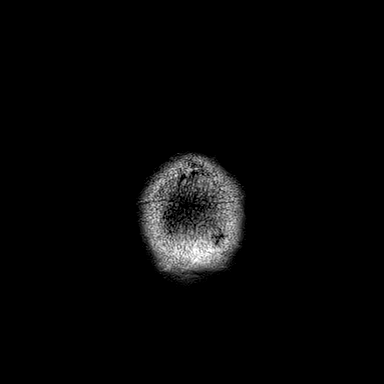
[im 32/32]
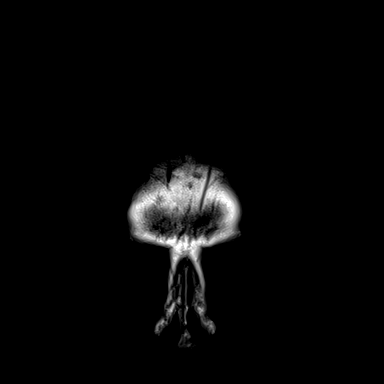

[Series 16: T1 post-contrast · axial · 1.0mm · 0.94mm/px · z∈[-37,+116]mm · 9 of 160 slices shown (1 of 3)]
[im 1/160]
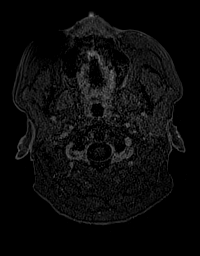
[im 20/160]
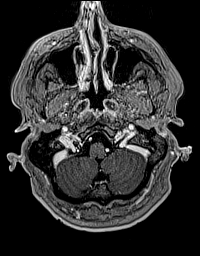
[im 40/160]
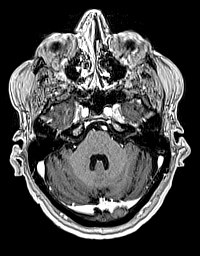
[im 60/160]
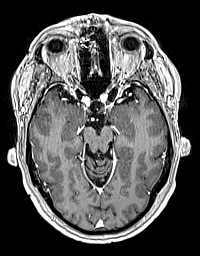
[im 80/160]
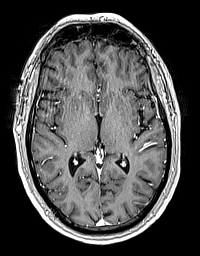
[im 100/160]
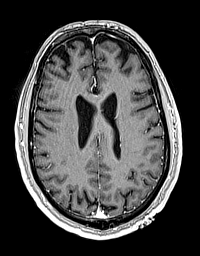
[im 120/160]
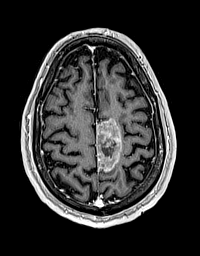
[im 140/160]
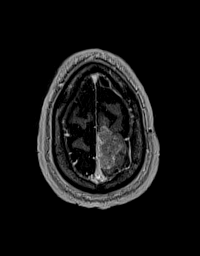
[im 160/160]
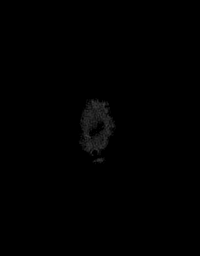

[Series 17: T1 post-contrast · coronal · 5.0mm · 0.43mm/px · 2 of 32 slices shown (2 of 3)]
[im 1/32]
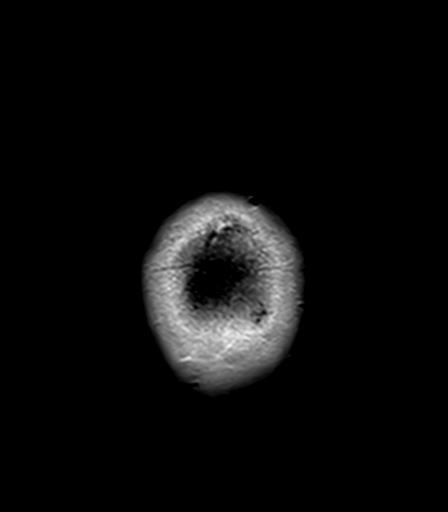
[im 32/32]
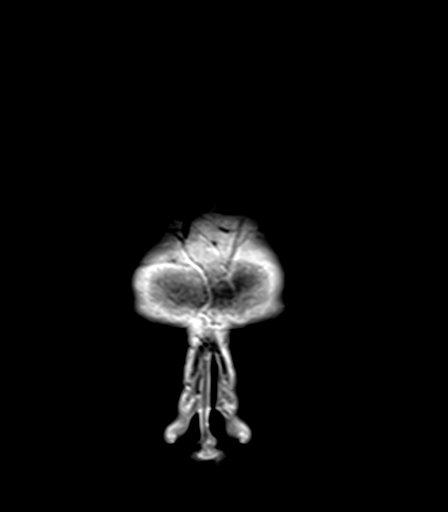

[Series 18: T1 post-contrast · sagittal · 5.0mm · 0.75mm/px · 2 of 26 slices shown (3 of 3)]
[im 1/26]
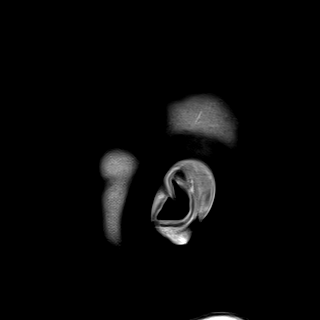
[im 26/26]
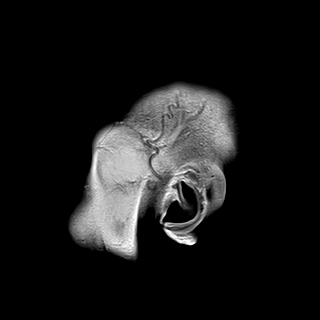

[48 of 48 positions shown; findings below may reference images not displayed]

FINDINGS: Brain: The brainstem and cerebellum remain normal. Right cerebral
hemisphere remains normal.

Within the medial left parietal lobe, there has been further growth
a solitary mass lesion, today measuring 5 cm cephalo caudal, 2.5 cm
right left and 5 cm front to back. Measurements on [DATE] were
3.8 x 2.2 x 4.0 cm. The lesion continues to be well-marginated.
There is now involvement the lateral aspect of the corpus callosum
on the left. Despite the presence of tumor growth, there is an
absence of vasogenic edema, which was present to a moderate degree
surrounding the lesion LOCKLEAR. Small internal blood products and
necrosis.

No hydrocephalus. No extra-axial collection. No new or satellite
lesion.

Vascular: Major vessels at the base of the brain show flow. Tumor
has a broad surface along the sagittal sinus but there is no
evidence of sinus invasion or thrombosis.

Skull and upper cervical spine: Craniotomy changes as before.

Sinuses/Orbits: Clear/normal

Other: None
IMPRESSION: Further growth of the medial left parietal intra-axial mass,
measuring 5 x 2.5 x 5 cm today compared with 3.8 x 2.2 x 4.0 cm
previously. Despite the growth, there is now an absence of
surrounding vasogenic edema. Broad surface along the superior
sagittal sinus but without sinus invasion or thrombosis. Tumor now
extends inferior to involve the left lateral corpus callosum.

## 2021-09-21 MED ORDER — GADOBUTROL 1 MMOL/ML IV SOLN
9.0000 mL | Freq: Once | INTRAVENOUS | Status: AC | PRN
  Administered 2021-09-21: 9 mL via INTRAVENOUS

## 2021-09-23 ENCOUNTER — Other Ambulatory Visit: Payer: Self-pay | Admitting: Internal Medicine

## 2021-09-23 DIAGNOSIS — R569 Unspecified convulsions: Secondary | ICD-10-CM

## 2021-09-24 ENCOUNTER — Inpatient Hospital Stay: Payer: Medicaid Other

## 2021-09-24 ENCOUNTER — Encounter: Payer: Self-pay | Admitting: Oncology

## 2021-09-24 ENCOUNTER — Inpatient Hospital Stay: Payer: Medicaid Other | Admitting: Internal Medicine

## 2021-09-26 ENCOUNTER — Other Ambulatory Visit: Payer: Self-pay

## 2021-09-26 ENCOUNTER — Inpatient Hospital Stay: Payer: Medicaid Other

## 2021-09-26 ENCOUNTER — Encounter: Payer: Self-pay | Admitting: Hematology and Oncology

## 2021-09-26 DIAGNOSIS — C711 Malignant neoplasm of frontal lobe: Secondary | ICD-10-CM

## 2021-09-26 DIAGNOSIS — Z5112 Encounter for antineoplastic immunotherapy: Secondary | ICD-10-CM | POA: Diagnosis not present

## 2021-09-26 LAB — TOTAL PROTEIN, URINE DIPSTICK: Protein, ur: NEGATIVE mg/dL

## 2021-09-26 LAB — COMPREHENSIVE METABOLIC PANEL
Albumin: 3.6 (ref 3.5–5.0)
Calcium: 9.3 (ref 8.7–10.7)

## 2021-09-26 LAB — HEPATIC FUNCTION PANEL
ALT: 19 (ref 10–40)
AST: 18 (ref 14–40)
Alkaline Phosphatase: 57 (ref 25–125)
Bilirubin, Total: 0.4

## 2021-09-26 LAB — BASIC METABOLIC PANEL
BUN: 17 (ref 4–21)
CO2: 27 — AB (ref 13–22)
Chloride: 110 — AB (ref 99–108)
Creatinine: 0.7 (ref 0.6–1.3)
Glucose: 99
Potassium: 3.9 (ref 3.4–5.3)
Sodium: 142 (ref 137–147)

## 2021-09-26 LAB — CBC AND DIFFERENTIAL
HCT: 45 (ref 41–53)
Hemoglobin: 15.6 (ref 13.5–17.5)
Neutrophils Absolute: 4.27
Platelets: 211 (ref 150–399)
WBC: 7.9

## 2021-09-26 LAB — CBC: RBC: 4.61 (ref 3.87–5.11)

## 2021-09-27 ENCOUNTER — Other Ambulatory Visit: Payer: Self-pay | Admitting: Oncology

## 2021-09-27 ENCOUNTER — Inpatient Hospital Stay (HOSPITAL_BASED_OUTPATIENT_CLINIC_OR_DEPARTMENT_OTHER): Payer: Medicaid Other | Admitting: Internal Medicine

## 2021-09-27 VITALS — BP 130/95 | HR 91 | Temp 97.6°F | Resp 19 | Ht 76.0 in | Wt 197.9 lb

## 2021-09-27 DIAGNOSIS — R569 Unspecified convulsions: Secondary | ICD-10-CM | POA: Diagnosis not present

## 2021-09-27 DIAGNOSIS — C711 Malignant neoplasm of frontal lobe: Secondary | ICD-10-CM

## 2021-09-27 DIAGNOSIS — Z5112 Encounter for antineoplastic immunotherapy: Secondary | ICD-10-CM | POA: Diagnosis not present

## 2021-09-27 MED ORDER — LEVETIRACETAM 750 MG PO TABS: 1500.0000 mg | ORAL_TABLET | Freq: Two times a day (BID) | ORAL | 2 refills | Status: DC

## 2021-09-27 MED ORDER — DIVALPROEX SODIUM 500 MG PO DR TAB: 500.0000 mg | DELAYED_RELEASE_TABLET | Freq: Two times a day (BID) | ORAL | 3 refills | Status: DC

## 2021-09-27 MED ORDER — LOMUSTINE 100 MG PO CAPS
100.0000 mg/m2 | ORAL_CAPSULE | Freq: Once | ORAL | 0 refills | Status: AC
  Filled 2021-09-27: qty 2, 1d supply, fill #0

## 2021-09-27 MED FILL — Bevacizumab-bvzr IV Soln 400 MG/16ML (For Infusion): INTRAVENOUS | Qty: 32 | Status: AC

## 2021-09-27 NOTE — Progress Notes (Signed)
Per previous note from 09/06/21:  Leave bevacizumab dose at 800 mg despite weight gain per Dr. Hinton Rao. Weight gain is likely due to edema.  Will continue to monitor.

## 2021-09-27 NOTE — Progress Notes (Signed)
[facility] [place], [place] [address] [phone]   Interval Evaluation  Date of Service: 09/27/21 Patient Name: Leonard [name] Patient MRN: [id number] Patient DOB: [birth year] Provider: [name], MD  Identifying Statement:  Leonard [name] is a 56 y.o. male with left frontal glioblastoma   Oncologic History: 06/15/20: Undergoes craniotomy, resection at [facility]; path demonstrates glioblastoma 07/25/20: Completes 3 weeks hypofractionated radiation with Temozolomide ([facility]) 09/29/20: Initiates therapy with 5-day Temozolomide 04/16/21: Progression of disease, referral placed for LITT vs re-resection at [facility] 07/19/21: After lost to follow up, further clinical and radiographic progression, declined surgery 09/21/21: Progression #3 after completing 4th cycle of avastin monotherapy  Biomarkers:  MGMT Unknown.  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  EGFR Unknown  TERT Unknown   Interval History:  Leonard [name] presents today for follow up after completing 4 cycles of avastin with Dr. Hinton Rao in [place].  He and his brother describe significant decline in strength on the right side (arm and leg) over the past few weeks.  They also describe impaired in expression and comprehension of language.  However, this has clearly improved since decadron was started on 07/19/21.  He is now reliant on family/help for most ADLs.  Continues on Keppra and Depakote as prior, did experience several breakthrough seizures prior to resumption of decadron.  They are having a very difficult time getting him out of the house to appointments, especially with large travel distance.  H+P (09/22/20) Patient presents today on behalf of Dr. Hinton Rao due to recent progression of tumor.  He describes ongoing right leg weakness, requiring use of a walker; this has not apparently worsened significantly in recent weeks.  He also acknowledges some clumsiness and dysfunction  affecting the right arm, although subtle.  Denies fatigue, memory issues.  No recent seizures, he has been compliant with Keppra and Depakote.  Vimpat was discontinued because of expense issues.  Otherwise denies new or progressive neurologic deficits.  Medications: Current Outpatient Medications on File Prior to Visit  Medication Sig Dispense Refill   albuterol (VENTOLIN HFA) 108 (90 Base) MCG/ACT inhaler Inhale 2 puffs into the lungs every 6 (six) hours as needed for wheezing or shortness of breath. 8 g 2   cephALEXin (KEFLEX) 500 MG capsule Take 1 capsule (500 mg total) by mouth 3 (three) times daily. 21 capsule 0   dexamethasone (DECADRON) 4 MG tablet Take 0.5 tablets (2 mg total) by mouth daily. 60 tablet 1   divalproex (DEPAKOTE) 500 MG DR tablet Take 1 tablet by mouth twice daily 60 tablet 0   furosemide (LASIX) 20 MG tablet Take 1 tablet (20 mg total) by mouth daily. 5 tablet 0   levETIRAcetam (KEPPRA) 750 MG tablet Take 2 tablets (1,500 mg total) by mouth 2 (two) times daily. 120 tablet 1   magic mouthwash w/lidocaine SOLN Take 5 mLs by mouth 4 (four) times daily as needed for mouth pain. 240 mL 3   No current facility-administered medications on file prior to visit.    Allergies: No Known Allergies Past Medical History: No past medical history on file. Past Surgical History:  Social History:  Social History   Socioeconomic History   Marital status: Single    Spouse name: Not on file   Number of children: Not on file   Years of education: Not on file   Highest education level: Not on file  Occupational History   Not on file  Tobacco Use  Smoking status: Never   Smokeless tobacco: Never  Substance and Sexual Activity   Alcohol use: Never   Drug use: Never   Sexual activity: Not Currently  Other Topics Concern   Not on file  Social History Narrative   Not on file   Social Determinants of Health   Financial Resource Strain: Not on file  Food Insecurity: Not on file   Transportation Needs: Not on file  Physical Activity: Not on file  Stress: Not on file  Social Connections: Not on file  Intimate Partner Violence: Not on file   Family History:  Family History  Problem Relation Age of Onset   Breast cancer Sister     Review of Systems: Constitutional: Doesn't report fevers, chills or abnormal weight loss Eyes: Doesn't report blurriness of vision Ears, nose, mouth, throat, and face: Doesn't report sore throat Respiratory: Doesn't report cough, dyspnea or wheezes Cardiovascular: Doesn't report palpitation, chest discomfort  Gastrointestinal:  Doesn't report nausea, constipation, diarrhea GU: Doesn't report incontinence Skin: Doesn't report skin rashes Neurological: Per HPI Musculoskeletal: Doesn't report joint pain Behavioral/Psych: Doesn't report anxiety  Physical Exam: Vitals:   09/27/21 1115  BP: (!) 130/95  Pulse: 91  Resp: 19  Temp: 97.6 F (36.4 C)  SpO2: 98%   KPS: 60. General: Alert, cooperative, pleasant, in no acute distress Head: Normal EENT: No conjunctival injection or scleral icterus.  Lungs: Resp effort normal Cardiac: Regular rate Abdomen: Non-distended abdomen Skin: No rashes cyanosis or petechiae. Extremities: No clubbing or edema  Neurologic Exam: Mental Status: Awake, alert, attentive to examiner. Oriented to self and environment. Mixed dysphasia  Cranial Nerves: Visual acuity is grossly normal. Visual fields are full. Extra-ocular movements intact. No ptosis. Face is symmetric Motor: Tone and bulk are normal. Power is 1/5 in right leg, 1/5 in right arm. Reflexes are symmetric, no pathologic reflexes present.  Sensory: Intact to light touch Gait: Non ambulatory   Labs: I have reviewed the data as listed    Component Value Date/Time   NA 142 09/26/2021 0000   K 3.9 09/26/2021 0000   CL 110 (A) 09/26/2021 0000   CO2 27 (A) 09/26/2021 0000   GLUCOSE 85 04/16/2021 0829   BUN 17 09/26/2021 0000    CREATININE 0.7 09/26/2021 0000   CREATININE 1.00 04/16/2021 0829   CALCIUM 9.3 09/26/2021 0000   PROT 6.8 04/16/2021 0829   ALBUMIN 3.6 09/26/2021 0000   AST 18 09/26/2021 0000   AST 9 (L) 04/16/2021 0829   ALT 19 09/26/2021 0000   ALT 14 04/16/2021 0829   ALKPHOS 57 09/26/2021 0000   BILITOT 0.3 04/16/2021 0829   GFRNONAA >60 04/16/2021 0829   Lab Results  Component Value Date   WBC 7.9 09/26/2021   NEUTROABS 4.27 09/26/2021   HGB 15.6 09/26/2021   HCT 45 09/26/2021   MCV 97 (A) 09/06/2021   PLT 211 09/26/2021   Imaging:  [place] Clinician Interpretation: I have personally reviewed the CNS images as listed.  My interpretation, in the context of the patient's clinical presentation, is progressive disease  CLINICAL DATA:  Brain/CNS neoplasm, assess treatment response. Glioblastoma. Restaging.   EXAM: MRI HEAD WITHOUT AND WITH CONTRAST   TECHNIQUE: Multiplanar, multiecho pulse sequences of the brain and surrounding structures were obtained without and with intravenous contrast.   CONTRAST:  11m GADAVIST GADOBUTROL 1 MMOL/ML IV SOLN   COMPARISON:  06/07/2021.  04/11/2021.  03/13/2021.  12/05/2020.   FINDINGS: Brain: The brainstem and cerebellum remain normal. Right cerebral  hemisphere remains normal.   Within the medial left parietal lobe, there has been further growth a solitary mass lesion, today measuring 5 cm cephalo caudal, 2.5 cm right left and 5 cm front to back. Measurements on 06/07/2021 were 3.8 x 2.2 x 4.0 cm. The lesion continues to be well-marginated. There is now involvement the lateral aspect of the corpus callosum on the left. Despite the presence of tumor growth, there is an absence of vasogenic edema, which was present to a moderate degree surrounding the lesion November. Small internal blood products and necrosis.   No hydrocephalus. No extra-axial collection. No new or satellite lesion.   Vascular: Major vessels at the base of the brain show  flow. Tumor has a broad surface along the sagittal sinus but there is no evidence of sinus invasion or thrombosis.   Skull and upper cervical spine: Craniotomy changes as before.   Sinuses/Orbits: Clear/normal   Other: None   IMPRESSION: Further growth of the medial left parietal intra-axial mass, measuring 5 x 2.5 x 5 cm today compared with 3.8 x 2.2 x 4.0 cm previously. Despite the growth, there is now an absence of surrounding vasogenic edema. Broad surface along the superior sagittal sinus but without sinus invasion or thrombosis. Tumor now extends inferior to involve the left lateral corpus callosum.     Electronically Signed   By: Nelson Chimes M.D.   On: 09/21/2021 15:19   Assessment/Plan Frontal glioblastoma ([place]) [C71.1]  Vela Prose Burkhead is clinically stable today, now having completed 4 cycles of avastin.  Unfortunately, MRI brain demonstrates continues progression of enhancing tumor volume.  T2/FLAIR signal abnormality is improved, which likely accounts for improved motor and cognitive function.    We again reviewed treatment plan pathways moving forward for progressive glioblastoma.  We discussed adding cytotoxic chemotherapy: CCNU 62m/m2 PO q6 weeks to his existing avastin treatment plan.  This could also be ordered and [place] as [place] preference.    Informed consent was obtained verbally at bedside to proceed with chemotherapy.  Chemotherapy should be held for the following:  ANC less than 1,000  Platelets less than 100,000  LFT or creatinine greater than 2x ULN  If clinical concerns/contraindications develop  Avastin should be held for the following:  ANC less than 500  Platelets less than 50,000  LFT or creatinine greater than 2x ULN  If clinical concerns/contraindications develop  He may con't decadron 255mQOD for now as discussed.  Will con't with  PT, OT and speech as well.  Should continue Keppra 150043mID, Depakote can decrease to 500m71mD.  KerrVela Prosel will return in ~6 weeks with next MRI brain for evaluation following cycle #1 of CCNU+avastin.  All questions were answered. The patient knows to call the clinic with any problems, questions or concerns. No barriers to learning were detected.  The total time spent in the encounter was 40 minutes and more than 50% was on counseling and review of test results   ZachVentura Sellers Medical Director of Neuro-Oncology [facility]23/23 11:17 AM

## 2021-09-28 ENCOUNTER — Telehealth: Payer: Self-pay | Admitting: Internal Medicine

## 2021-09-28 ENCOUNTER — Telehealth: Payer: Self-pay

## 2021-09-28 ENCOUNTER — Telehealth: Payer: Self-pay | Admitting: Pharmacy Technician

## 2021-09-28 ENCOUNTER — Inpatient Hospital Stay: Payer: Medicaid Other

## 2021-09-28 ENCOUNTER — Other Ambulatory Visit: Payer: Self-pay | Admitting: Oncology

## 2021-09-28 ENCOUNTER — Other Ambulatory Visit: Payer: Self-pay

## 2021-09-28 ENCOUNTER — Other Ambulatory Visit (HOSPITAL_COMMUNITY): Payer: Self-pay

## 2021-09-28 ENCOUNTER — Encounter: Payer: Self-pay | Admitting: Oncology

## 2021-09-28 VITALS — BP 127/86 | HR 86 | Temp 98.4°F | Resp 19 | Ht 76.0 in | Wt 197.1 lb

## 2021-09-28 DIAGNOSIS — C711 Malignant neoplasm of frontal lobe: Secondary | ICD-10-CM

## 2021-09-28 DIAGNOSIS — Z5112 Encounter for antineoplastic immunotherapy: Secondary | ICD-10-CM | POA: Diagnosis not present

## 2021-09-28 MED ORDER — SODIUM CHLORIDE 0.9 % IV SOLN
10.0000 mg/kg | Freq: Once | INTRAVENOUS | Status: AC
  Administered 2021-09-28: 800 mg via INTRAVENOUS
  Filled 2021-09-28: qty 32

## 2021-09-28 MED ORDER — SODIUM CHLORIDE 0.9 % IV SOLN: Freq: Once | INTRAVENOUS | Status: AC

## 2021-09-28 MED ORDER — LOMUSTINE 100 MG PO CAPS
100.0000 mg/m2 | ORAL_CAPSULE | Freq: Once | ORAL | 0 refills | Status: AC
  Filled 2021-09-28: qty 2, 1d supply, fill #0

## 2021-09-28 NOTE — Patient Instructions (Signed)
Bevacizumab injection What is this medication? BEVACIZUMAB (be va SIZ yoo mab) is a monoclonal antibody. It is used to treat many types of cancer. This medicine may be used for other purposes; ask your health care provider or pharmacist if you have questions. COMMON BRAND NAME(S): Alymsys, Avastin, MVASI, Noah Charon What should I tell my care team before I take this medication? They need to know if you have any of these conditions: diabetes heart disease high blood pressure history of coughing up blood prior anthracycline chemotherapy (e.g., doxorubicin, daunorubicin, epirubicin) recent or ongoing radiation therapy recent or planning to have surgery stroke an unusual or allergic reaction to bevacizumab, hamster proteins, mouse proteins, other medicines, foods, dyes, or preservatives pregnant or trying to get pregnant breast-feeding How should I use this medication? This medicine is for infusion into a vein. It is given by a health care professional in a hospital or clinic setting. Talk to your pediatrician regarding the use of this medicine in children. Special care may be needed. Overdosage: If you think you have taken too much of this medicine contact a poison control center or emergency room at once. NOTE: This medicine is only for you. Do not share this medicine with others. What if I miss a dose? It is important not to miss your dose. Call your doctor or health care professional if you are unable to keep an appointment. What may interact with this medication? Interactions are not expected. This list may not describe all possible interactions. Give your health care provider a list of all the medicines, herbs, non-prescription drugs, or dietary supplements you use. Also tell them if you smoke, drink alcohol, or use illegal drugs. Some items may interact with your medicine. What should I watch for while using this medication? Your condition will be monitored carefully while you are  receiving this medicine. You will need important blood work and urine testing done while you are taking this medicine. This medicine may increase your risk to bruise or bleed. Call your doctor or health care professional if you notice any unusual bleeding. Before having surgery, talk to your health care provider to make sure it is ok. This drug can increase the risk of poor healing of your surgical site or wound. You will need to stop this drug for 28 days before surgery. After surgery, wait at least 28 days before restarting this drug. Make sure the surgical site or wound is healed enough before restarting this drug. Talk to your health care provider if questions. Do not become pregnant while taking this medicine or for 6 months after stopping it. Women should inform their doctor if they wish to become pregnant or think they might be pregnant. There is a potential for serious side effects to an unborn child. Talk to your health care professional or pharmacist for more information. Do not breast-feed an infant while taking this medicine and for 6 months after the last dose. This medicine has caused ovarian failure in some women. This medicine may interfere with the ability to have a child. You should talk to your doctor or health care professional if you are concerned about your fertility. What side effects may I notice from receiving this medication? Side effects that you should report to your doctor or health care professional as soon as possible: allergic reactions like skin rash, itching or hives, swelling of the face, lips, or tongue chest pain or chest tightness chills coughing up blood high fever seizures severe constipation signs and symptoms of bleeding  such as bloody or black, tarry stools; red or dark-brown urine; spitting up blood or brown material that looks like coffee grounds; red spots on the skin; unusual bruising or bleeding from the eye, gums, or nose signs and symptoms of a blood  clot such as breathing problems; chest pain; severe, sudden headache; pain, swelling, warmth in the leg signs and symptoms of a stroke like changes in vision; confusion; trouble speaking or understanding; severe headaches; sudden numbness or weakness of the face, arm or leg; trouble walking; dizziness; loss of balance or coordination stomach pain sweating swelling of legs or ankles vomiting weight gain Side effects that usually do not require medical attention (report to your doctor or health care professional if they continue or are bothersome): back pain changes in taste decreased appetite dry skin nausea tiredness This list may not describe all possible side effects. Call your doctor for medical advice about side effects. You may report side effects to FDA at 1-800-FDA-1088. Where should I keep my medication? This drug is given in a hospital or clinic and will not be stored at home. NOTE: This sheet is a summary. It may not cover all possible information. If you have questions about this medicine, talk to your doctor, pharmacist, or health care provider.  2022 Elsevier/Gold Standard (2021-04-10 00:00:00)

## 2021-09-28 NOTE — Progress Notes (Signed)
Sent in application to [place] for free Gleostine.

## 2021-09-28 NOTE — Telephone Encounter (Signed)
Scheduled per 2/23 los, pt has been called and confirmed  °

## 2021-09-28 NOTE — Telephone Encounter (Signed)
Called and spoke to patient about benefits findings, patient will come by office to sign forms. Advised of required proof of income. Emailed forms over to office and sent message.  [employer] PH: [phone] option 2

## 2021-09-28 NOTE — Telephone Encounter (Signed)
Received New start notification for  Gleostine 100mg  . Will update as we work through the benefits process.   Attempted to submit PA through patient's Managed Medicaid plan ([employer]) and received the following message:  This request cannot be processed due to the medication is not covered by the plan.  Will initiate patient assistance.

## 2021-09-28 NOTE — Telephone Encounter (Addendum)
Oral Oncology Pharmacist Encounter  Received new prescription for lomustine (Gleostine) for the treatment of frontal glioblastoma in conjunction with bevacizumab, planned duration until disease progression or unacceptable toxicity.  Labs from 09/26/21 assessed, no interventions needed.  Current medication list in Epic reviewed, DDIs with Gleostine identified: - depakote: can increase the adverse effects of lomustine (monitor therapy)  Evaluated chart and no patient barriers to medication adherence noted.   Patient agreement for treatment documented in MD note on 09/27/2021.  Prescription has been e-scribed to the [facility] for benefits analysis and approval.  Oral Oncology Clinic will continue to follow for insurance authorization, copayment issues, initial counseling and start date.  Drema Halon, PharmD Hematology/Oncology Clinical Pharmacist [facility] [phone] 09/28/2021 9:44 AM

## 2021-10-03 NOTE — Telephone Encounter (Signed)
Received notification that PAP was approved for patient. Medication will ship to MD's office-  ? ? ?

## 2021-10-04 ENCOUNTER — Other Ambulatory Visit: Payer: Self-pay | Admitting: Hematology and Oncology

## 2021-10-04 MED ORDER — ONDANSETRON HCL 4 MG PO TABS: 4.0000 mg | ORAL_TABLET | ORAL | 3 refills | Status: DC | PRN

## 2021-10-04 NOTE — Telephone Encounter (Signed)
Attempted to call patient, had to leave a message. ?

## 2021-10-04 NOTE — Progress Notes (Signed)
Patient called in, I let him know he was approved for free Gleostine. Informed him medication should be delivered to [facility] and that I would call him once it has.  ?

## 2021-10-04 NOTE — Telephone Encounter (Signed)
Oral Chemotherapy Pharmacist Encounter ? ?I spoke with patient for overview of: lomustine for the treatment of glioblastoma multiforme in conjunction with bevacizumab, planned duration until disease progression or unacceptable toxicity. ? ?Counseled patient on administration, dosing, side effects, monitoring, drug-food interactions, safe handling, storage, and disposal. ? ?Patient will take lomustine 100mg  capsules, 2 capsule (200mg ) by mouth once daily, may take at bedtime and on an empty stomach to decrease nausea and vomiting. ? ?Lomustine will be administered once every 6 weeks. ? ?Lomustine start date: 10/05/21 ?  ?Patient will take Zofran 4mg  tablet, 1 tablet by mouth 30-60 min prior to lomustine dose to help decrease N/V. ?  ?Adverse effects include but are not limited to: nausea, vomiting, stomatitis, alopecia, decreased blood counts, alopecia, hepatotoxicity, and pulmonary toxicity. ? ?Reviewed with patient importance of keeping a medication schedule and plan for any missed doses. No barriers to medication adherence identified. ? ?Medication reconciliation performed and medication/allergy list updated. ? ?Insurance authorization for lomustine has been obtained. ?Patient receives medication through patient assistance program through [employer]. Medication has to be delivered to the prescribers office.  ? ?Patient aware he will pick up medication.  ? ?Patient informed the pharmacy will reach out 5-7 days prior to needing next fill of lomustine to coordinate continued medication acquisition to prevent break in therapy. ? ?All questions answered. ? ?Mr. Mabie voiced understanding and appreciation.  ? ?Medication education handout placed in mail for patient. Patient knows to call the office with questions or concerns. Oral Chemotherapy Clinic phone number provided to patient.  ? ?Drema Halon, PharmD ?Hematology/Oncology Clinical Pharmacist ?[facility] ?[phone] ?10/04/2021   3:04 PM ? ?

## 2021-10-10 ENCOUNTER — Encounter: Payer: Self-pay | Admitting: Hematology and Oncology

## 2021-10-10 ENCOUNTER — Inpatient Hospital Stay (INDEPENDENT_AMBULATORY_CARE_PROVIDER_SITE_OTHER): Payer: Medicaid Other | Admitting: Hematology and Oncology

## 2021-10-10 ENCOUNTER — Inpatient Hospital Stay: Payer: Medicaid Other | Attending: Internal Medicine

## 2021-10-10 ENCOUNTER — Other Ambulatory Visit: Payer: Self-pay

## 2021-10-10 DIAGNOSIS — Z5112 Encounter for antineoplastic immunotherapy: Secondary | ICD-10-CM | POA: Diagnosis not present

## 2021-10-10 DIAGNOSIS — C711 Malignant neoplasm of frontal lobe: Secondary | ICD-10-CM | POA: Diagnosis not present

## 2021-10-10 DIAGNOSIS — Z79899 Other long term (current) drug therapy: Secondary | ICD-10-CM | POA: Diagnosis not present

## 2021-10-10 LAB — CBC AND DIFFERENTIAL
HCT: 44 (ref 41–53)
Hemoglobin: 15 (ref 13.5–17.5)
Neutrophils Absolute: 7.7
Platelets: 217 10*3/uL (ref 150–400)
WBC: 10

## 2021-10-10 LAB — TOTAL PROTEIN, URINE DIPSTICK: Protein, ur: NEGATIVE mg/dL

## 2021-10-10 LAB — COMPREHENSIVE METABOLIC PANEL
Albumin: 3.8 (ref 3.5–5.0)
Calcium: 9.7 (ref 8.7–10.7)

## 2021-10-10 LAB — CBC: RBC: 4.62 (ref 3.87–5.11)

## 2021-10-10 LAB — BASIC METABOLIC PANEL
BUN: 15 (ref 4–21)
CO2: 27 — AB (ref 13–22)
Chloride: 108 (ref 99–108)
Creatinine: 0.8 (ref 0.6–1.3)
Glucose: 109
Potassium: 4.3 mEq/L (ref 3.5–5.1)
Sodium: 142 (ref 137–147)

## 2021-10-10 LAB — HEPATIC FUNCTION PANEL
ALT: 20 U/L (ref 10–40)
AST: 18 (ref 14–40)
Alkaline Phosphatase: 52 (ref 25–125)
Bilirubin, Total: 0.5

## 2021-10-10 NOTE — Progress Notes (Cosign Needed)
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  We know he had two surgeries in November, the first to attempt biopsy and the second for resection.  He started radiation with concurrent temozolomide on December 2nd, and completed 15 fractions of radiation therapy on December 21st.  He now has rapid recurrence with a 2.9 cm lesion in the left posterior frontal lobe. He has been on bevacizumab every 2 weeks and tolerating well. He now has continued progression as noted on MRI of the brain. Lomustine was added to his bevacizumab per Dr. Mickeal Skinner. He took his first dose last week. We will schedule him for follow up MRI here according to Dr. Renda Rolls recommendations. He will proceed with bevacizumab this week.  He will return to clinic in 2 weeks for repeat evaluation.    The Leonard [name] understands the plans discussed today and is in agreement with them.  He knows to contact our office if he develops concerns prior to his next appointment.    [name], NP  [facility] [address] [place] [address] Dept: [phone] Dept Fax: [phone]   No orders of the defined types were placed in this encounter.     CHIEF COMPLAINT:  CC: A 56 year old male with history of glioblastoma here for pre-treatment evaluation  Current Treatment:  Bevacizumab, Lomustine  INTERVAL HISTORY:  Leonard [name] is here today for repeat clinical assessment. He denies fevers or chills. He denies pain. His appetite is good. His weight has been stable.  I have reviewed the past medical history, past surgical history, social  history and family history with the Leonard [name] and they are unchanged from previous note.  ALLERGIES:  has No Known Allergies.  MEDICATIONS:  Current Outpatient Medications  Medication Sig Dispense Refill   ondansetron (ZOFRAN) 4 MG tablet Take 1 tablet (4 mg total) by mouth every 4 (four) hours as needed for nausea. 90 tablet 3   albuterol (VENTOLIN HFA) 108 (90 Base) MCG/ACT inhaler Inhale 2 puffs into the lungs every 6 (six) hours as needed for wheezing or shortness of breath. 8 g 2   dexamethasone (DECADRON) 4 MG tablet Take 0.5 tablets (2 mg total) by mouth daily. 60 tablet 1   divalproex (DEPAKOTE) 500 MG DR tablet Take 1 tablet (500 mg total) by mouth 2 (two) times daily. 60 tablet 3   furosemide (LASIX) 20 MG tablet Take 1 tablet (20 mg total) by mouth daily. 5 tablet 0   levETIRAcetam (KEPPRA) 750 MG tablet Take 2 tablets (1,500 mg total) by mouth 2 (two) times daily. 120 tablet 2   lomustine (GLEOSTINE) 100 MG capsule Take 100 mg/m2 by mouth once. Take 2 capsules ('200mg'$ ) by mouth once every 6 weeks.Take on an emtpy stomach 1 hour before or 2 hours after meals. Caution:chemotherapy     magic mouthwash w/lidocaine SOLN Take 5 mLs by mouth 4 (four) times daily as needed for mouth pain. 240 mL 3   No current facility-administered medications for this visit.    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[place])  06/15/2020 Cancer Staging   Staging form: Brain and Spinal Cord, AJCC 8th Edition - Pathologic stage from 06/15/2020: WHO Grade IV - Signed by [name], MD on 09/30/2020 Histopathologic type: Glioblastoma Stage prefix: Initial diagnosis Histologic grading system: 4 grade system Extent of surgical resection: Complete (gross) resection Solitary (s) or multifocal (m) tumors in the primary site: Solitary Seizures at presentation: Present Duration of symptoms before  diagnosis: Short    09/29/2020 - 09/29/2020 Chemotherapy   Leonard [name] is on Treatment Plan : BRAIN GLIOBLASTOMA Consolidation Temozolomide Days 1-5 q28 Days      08/03/2021 -  Chemotherapy   Leonard [name] is on Treatment Plan : BRAIN GBM Bevacizumab 14d x 6 cycles         REVIEW OF SYSTEMS:   Constitutional: Denies fevers, chills or abnormal weight loss Eyes: Denies blurriness of vision Ears, nose, mouth, throat, and face: Denies mucositis or sore throat Respiratory: Denies cough, dyspnea or wheezes Cardiovascular: Denies palpitation, chest discomfort or lower extremity swelling Gastrointestinal:  Denies nausea, heartburn or change in bowel habits Skin: Denies abnormal skin rashes Lymphatics: Denies new lymphadenopathy or easy bruising Neurological:Denies numbness, tingling or new weaknesses Behavioral/Psych: Mood is stable, no new changes  All other systems were reviewed with the Leonard [name] and are negative.   VITALS:  Blood pressure 128/85, pulse 88, temperature (!) 97.5 F (36.4 C), temperature source Oral, resp. rate (!) 22, height '6\' 4"'$  (1.93 m), weight 199 lb 14.4 oz (90.7 kg), SpO2 95 %.  Wt Readings from Last 3 Encounters:  10/10/21 199 lb 14.4 oz (90.7 kg)  09/28/21 197 lb 2.2 oz (89.4 kg)  09/27/21 197 lb 14.4 oz (89.8 kg)    Body mass index is 24.33 kg/m.  Performance status (ECOG): 1 - Symptomatic but completely ambulatory  PHYSICAL EXAM:   GENERAL:alert, no distress and comfortable SKIN: skin color, texture, turgor are normal, no rashes or significant lesions EYES: normal, Conjunctiva are pink and non-injected, sclera clear OROPHARYNX:no exudate, no erythema and lips, buccal mucosa, and tongue normal  NECK: supple, thyroid normal size, non-tender, without nodularity LYMPH:  no palpable lymphadenopathy in the cervical, axillary or inguinal LUNGS: clear to auscultation and percussion with normal breathing effort HEART: regular rate & rhythm and no murmurs and no lower  extremity edema ABDOMEN:abdomen soft, non-tender and normal bowel sounds Musculoskeletal:no cyanosis of digits and no clubbing  NEURO: alert & oriented x 3 with fluent speech, no focal motor/sensory deficits  LABORATORY DATA:  I have reviewed the data as listed    Component Value Date/Time   NA 142 10/10/2021 0000   K 4.3 10/10/2021 0000   CL 108 10/10/2021 0000   CO2 27 (A) 10/10/2021 0000   GLUCOSE 85 04/16/2021 0829   BUN 15 10/10/2021 0000   CREATININE 0.8 10/10/2021 0000   CREATININE 1.00 04/16/2021 0829   CALCIUM 9.7 10/10/2021 0000   PROT 6.8 04/16/2021 0829   ALBUMIN 3.8 10/10/2021 0000   AST 18 10/10/2021 0000   AST 9 (L) 04/16/2021 0829   ALT 20 10/10/2021 0000   ALT 14 04/16/2021 0829   ALKPHOS 52 10/10/2021 0000   BILITOT 0.3 04/16/2021 0829   GFRNONAA >60 04/16/2021 0829    No results found for: SPEP, UPEP  Lab Results  Component Value Date   WBC 10.0 10/10/2021   NEUTROABS 7.70 10/10/2021   HGB 15.0 10/10/2021   HCT 44 10/10/2021   MCV 97 (A) 09/06/2021   PLT 217 10/10/2021      Chemistry  Component Value Date/Time   NA 142 10/10/2021 0000   K 4.3 10/10/2021 0000   CL 108 10/10/2021 0000   CO2 27 (A) 10/10/2021 0000   BUN 15 10/10/2021 0000   CREATININE 0.8 10/10/2021 0000   CREATININE 1.00 04/16/2021 0829   GLU 109 10/10/2021 0000      Component Value Date/Time   CALCIUM 9.7 10/10/2021 0000   ALKPHOS 52 10/10/2021 0000   AST 18 10/10/2021 0000   AST 9 (L) 04/16/2021 0829   ALT 20 10/10/2021 0000   ALT 14 04/16/2021 0829   BILITOT 0.3 04/16/2021 0829       RADIOGRAPHIC STUDIES: I have personally reviewed the radiological images as listed and agreed with the findings in the report. MR BRAIN W WO CONTRAST  Result Date: 09/21/2021 CLINICAL DATA:  Brain/CNS neoplasm, assess treatment response. Glioblastoma. Restaging. EXAM: MRI HEAD WITHOUT AND WITH CONTRAST TECHNIQUE: Multiplanar, multiecho pulse sequences of the brain and  surrounding structures were obtained without and with intravenous contrast. CONTRAST:  56m GADAVIST GADOBUTROL 1 MMOL/ML IV SOLN COMPARISON:  06/07/2021.  04/11/2021.  03/13/2021.  12/05/2020. FINDINGS: Brain: The brainstem and cerebellum remain normal. Right cerebral hemisphere remains normal. Within the medial left parietal lobe, there has been further growth a solitary mass lesion, today measuring 5 cm cephalo caudal, 2.5 cm right left and 5 cm front to back. Measurements on 06/07/2021 were 3.8 x 2.2 x 4.0 cm. The lesion continues to be well-marginated. There is now involvement the lateral aspect of the corpus callosum on the left. Despite the presence of tumor growth, there is an absence of vasogenic edema, which was present to a moderate degree surrounding the lesion November. Small internal blood products and necrosis. No hydrocephalus. No extra-axial collection. No new or satellite lesion. Vascular: Major vessels at the base of the brain show flow. Tumor has a broad surface along the sagittal sinus but there is no evidence of sinus invasion or thrombosis. Skull and upper cervical spine: Craniotomy changes as before. Sinuses/Orbits: Clear/normal Other: None IMPRESSION: Further growth of the medial left parietal intra-axial mass, measuring 5 x 2.5 x 5 cm today compared with 3.8 x 2.2 x 4.0 cm previously. Despite the growth, there is now an absence of surrounding vasogenic edema. Broad surface along the superior sagittal sinus but without sinus invasion or thrombosis. Tumor now extends inferior to involve the left lateral corpus callosum. Electronically Signed   By: MNelson ChimesM.D.   On: 09/21/2021 15:19

## 2021-10-10 NOTE — Assessment & Plan Note (Signed)
Grade 4 left frontal glioblastoma. ?We know he had two surgeries in November, the first to attempt biopsy and the second for resection.??He started radiation with concurrent temozolomide on December 2nd, and completed 15 fractions of radiation therapy on December 21st. ?He now has rapid recurrence with a 2.9 cm lesion in the left posterior frontal lobe. He has been on bevacizumab every 2 weeks and tolerating well. He now has continued progression as noted on MRI of the brain. Lomustine was added to his bevacizumab per Dr. Mickeal Skinner. He took his first dose last week. We will schedule him for follow up MRI here according to Dr. Renda Rolls recommendations. He will proceed with bevacizumab this week.  He will return to clinic in 2 weeks for repeat evaluation.  ?

## 2021-10-11 MED FILL — Bevacizumab-bvzr IV Soln 400 MG/16ML (For Infusion): INTRAVENOUS | Qty: 32 | Status: AC

## 2021-10-12 ENCOUNTER — Telehealth: Payer: Self-pay

## 2021-10-12 ENCOUNTER — Inpatient Hospital Stay: Payer: Medicaid Other

## 2021-10-12 VITALS — BP 129/74 | HR 81 | Temp 98.2°F | Resp 18 | Ht 76.0 in | Wt 197.0 lb

## 2021-10-12 DIAGNOSIS — Z5112 Encounter for antineoplastic immunotherapy: Secondary | ICD-10-CM | POA: Diagnosis not present

## 2021-10-12 DIAGNOSIS — C711 Malignant neoplasm of frontal lobe: Secondary | ICD-10-CM

## 2021-10-12 MED ORDER — SODIUM CHLORIDE 0.9% FLUSH: 10.0000 mL | INTRAVENOUS | Status: DC | PRN

## 2021-10-12 MED ORDER — SODIUM CHLORIDE 0.9 % IV SOLN: Freq: Once | INTRAVENOUS | Status: AC

## 2021-10-12 MED ORDER — SODIUM CHLORIDE 0.9 % IV SOLN
10.0000 mg/kg | Freq: Once | INTRAVENOUS | Status: AC
  Administered 2021-10-12: 800 mg via INTRAVENOUS
  Filled 2021-10-12: qty 32

## 2021-10-12 MED ORDER — HEPARIN SOD (PORK) LOCK FLUSH 100 UNIT/ML IV SOLN: 500.0000 [IU] | Freq: Once | INTRAVENOUS | Status: DC | PRN

## 2021-10-12 NOTE — Patient Instructions (Signed)
Bevacizumab injection ?What is this medication? ?BEVACIZUMAB (be va SIZ yoo mab) is a monoclonal antibody. It is used to treat many types of cancer. ?This medicine may be used for other purposes; ask your health care provider or pharmacist if you have questions. ?COMMON BRAND NAME(S): Alymsys, Avastin, MVASI, Zirabev ?What should I tell my care team before I take this medication? ?They need to know if you have any of these conditions: ?diabetes ?heart disease ?high blood pressure ?history of coughing up blood ?prior anthracycline chemotherapy (e.g., doxorubicin, daunorubicin, epirubicin) ?recent or ongoing radiation therapy ?recent or planning to have surgery ?stroke ?an unusual or allergic reaction to bevacizumab, hamster proteins, mouse proteins, other medicines, foods, dyes, or preservatives ?pregnant or trying to get pregnant ?breast-feeding ?How should I use this medication? ?This medicine is for infusion into a vein. It is given by a health care professional in a hospital or clinic setting. ?Talk to your pediatrician regarding the use of this medicine in children. Special care may be needed. ?Overdosage: If you think you have taken too much of this medicine contact a poison control center or emergency room at once. ?NOTE: This medicine is only for you. Do not share this medicine with others. ?What if I miss a dose? ?It is important not to miss your dose. Call your doctor or health care professional if you are unable to keep an appointment. ?What may interact with this medication? ?Interactions are not expected. ?This list may not describe all possible interactions. Give your health care provider a list of all the medicines, herbs, non-prescription drugs, or dietary supplements you use. Also tell them if you smoke, drink alcohol, or use illegal drugs. Some items may interact with your medicine. ?What should I watch for while using this medication? ?Your condition will be monitored carefully while you are  receiving this medicine. You will need important blood work and urine testing done while you are taking this medicine. ?This medicine may increase your risk to bruise or bleed. Call your doctor or health care professional if you notice any unusual bleeding. ?Before having surgery, talk to your health care provider to make sure it is ok. This drug can increase the risk of poor healing of your surgical site or wound. You will need to stop this drug for 28 days before surgery. After surgery, wait at least 28 days before restarting this drug. Make sure the surgical site or wound is healed enough before restarting this drug. Talk to your health care provider if questions. ?Do not become pregnant while taking this medicine or for 6 months after stopping it. Women should inform their doctor if they wish to become pregnant or think they might be pregnant. There is a potential for serious side effects to an unborn child. Talk to your health care professional or pharmacist for more information. Do not breast-feed an infant while taking this medicine and for 6 months after the last dose. ?This medicine has caused ovarian failure in some women. This medicine may interfere with the ability to have a child. You should talk to your doctor or health care professional if you are concerned about your fertility. ?What side effects may I notice from receiving this medication? ?Side effects that you should report to your doctor or health care professional as soon as possible: ?allergic reactions like skin rash, itching or hives, swelling of the face, lips, or tongue ?chest pain or chest tightness ?chills ?coughing up blood ?high fever ?seizures ?severe constipation ?signs and symptoms of bleeding  such as bloody or black, tarry stools; red or dark-brown urine; spitting up blood or brown material that looks like coffee grounds; red spots on the skin; unusual bruising or bleeding from the eye, gums, or nose ?signs and symptoms of a blood  clot such as breathing problems; chest pain; severe, sudden headache; pain, swelling, warmth in the leg ?signs and symptoms of a stroke like changes in vision; confusion; trouble speaking or understanding; severe headaches; sudden numbness or weakness of the face, arm or leg; trouble walking; dizziness; loss of balance or coordination ?stomach pain ?sweating ?swelling of legs or ankles ?vomiting ?weight gain ?Side effects that usually do not require medical attention (report to your doctor or health care professional if they continue or are bothersome): ?back pain ?changes in taste ?decreased appetite ?dry skin ?nausea ?tiredness ?This list may not describe all possible side effects. Call your doctor for medical advice about side effects. You may report side effects to FDA at 1-800-FDA-1088. ?Where should I keep my medication? ?This drug is given in a hospital or clinic and will not be stored at home. ?NOTE: This sheet is a summary. It may not cover all possible information. If you have questions about this medicine, talk to your doctor, pharmacist, or health care provider. ?? 2022 Elsevier/Gold Standard (2021-04-10 00:00:00) ? ?

## 2021-10-12 NOTE — Telephone Encounter (Signed)
I spoke with pt. He states, "I think I'm doing pretty good. I went for my infusion today and didn't have any problems". Pt admits to periodic nausea, which he treats with antiemetics w/relief. No emesis, rash, fevers. I reminded pt of the importance of calling us if he develops temp of 100.4 or higher, day or night. He verbalized understanding. ?

## 2021-10-24 ENCOUNTER — Inpatient Hospital Stay: Payer: Medicaid Other

## 2021-10-24 ENCOUNTER — Encounter: Payer: Self-pay | Admitting: Hematology and Oncology

## 2021-10-24 ENCOUNTER — Other Ambulatory Visit: Payer: Self-pay

## 2021-10-24 ENCOUNTER — Inpatient Hospital Stay (INDEPENDENT_AMBULATORY_CARE_PROVIDER_SITE_OTHER): Payer: Medicaid Other | Admitting: Hematology and Oncology

## 2021-10-24 DIAGNOSIS — C711 Malignant neoplasm of frontal lobe: Secondary | ICD-10-CM

## 2021-10-24 DIAGNOSIS — R609 Edema, unspecified: Secondary | ICD-10-CM

## 2021-10-24 DIAGNOSIS — Z5112 Encounter for antineoplastic immunotherapy: Secondary | ICD-10-CM | POA: Diagnosis not present

## 2021-10-24 LAB — CBC AND DIFFERENTIAL
HCT: 47 (ref 41–53)
Hemoglobin: 15.4 (ref 13.5–17.5)
Neutrophils Absolute: 5.99
Platelets: 107 10*3/uL — AB (ref 150–400)
WBC: 8.2

## 2021-10-24 LAB — BASIC METABOLIC PANEL
BUN: 21 (ref 4–21)
CO2: 26 — AB (ref 13–22)
Chloride: 108 (ref 99–108)
Creatinine: 0.8 (ref 0.6–1.3)
Glucose: 154
Potassium: 4.2 mEq/L (ref 3.5–5.1)
Sodium: 141 (ref 137–147)

## 2021-10-24 LAB — TOTAL PROTEIN, URINE DIPSTICK: Protein, ur: NEGATIVE mg/dL

## 2021-10-24 LAB — COMPREHENSIVE METABOLIC PANEL
Albumin: 3.8 (ref 3.5–5.0)
Calcium: 9.4 (ref 8.7–10.7)

## 2021-10-24 LAB — CBC: RBC: 4.78 (ref 3.87–5.11)

## 2021-10-24 LAB — HEPATIC FUNCTION PANEL
ALT: 22 U/L (ref 10–40)
AST: 16 (ref 14–40)
Alkaline Phosphatase: 61 (ref 25–125)
Bilirubin, Total: 0.5

## 2021-10-24 NOTE — Progress Notes (Addendum)
?Patient Care Team: ?Patient, No Pcp Per (Inactive) as PCP - General (General Practice) ?[name], MD as Consulting Physician (Oncology) ?[name], MD as Consulting Physician (Neurology) ? ?Clinic Day:  11/08/2021 ? ?Referring physician: [name], MD ? ?ASSESSMENT & PLAN:  ? ?Assessment & Plan: ?Frontal glioblastoma ([place]) ?Grade 4 left frontal glioblastoma.  We know he had two surgeries in November, the first to attempt biopsy and the second for resection.  He started radiation with concurrent temozolomide on December 2nd, and completed 15 fractions of radiation therapy on December 21st.  He now has rapid recurrence with a 2.9 cm lesion in the left posterior frontal lobe. He has been on bevacizumab every 2 weeks and tolerating well. He now has continued progression as noted on MRI of the brain. Lomustine was added to his bevacizumab per Dr. Mickeal Skinner. He took his first dose 2 weeks ago without difficulty. We will schedule him for follow up MRI here according to Dr. Renda Rolls recommendations. He will proceed with bevacizumab this week.  He will return to clinic in 2 weeks for repeat evaluation.  ? ?Edema ?Worsening right lower extremity edema with 4+ pitting. Ultrasound was negative. Weeping areas to foot noted from swelling with multiple scabbed over areas. Dr. Sherrilee Gilles aware and attributes to decreased healing due to dexamethasone and bevacizumab. He recommended weaning dexamethasone which he is currently doing. We will repeat a course of antibiotics and try a few days of lasix. He has follow up on 2/6 with Dr. Sherrilee Gilles. This edema is significantly improved today. ?  ? ?The patient understands the plans discussed today and is in agreement with them.  He knows to contact our office if he develops concerns prior to his next appointment. ? ? ? ?[name], NP  ?[facility] ?[place] [address] ?Dept:  [phone] ?Dept Fax: [phone]  ? ?No orders of the defined types were placed in this encounter. ?  ? ? ?CHIEF COMPLAINT:  ?CC: A 56 year old male with history of glioblastoma here for 2 week evaluation ? ?Current Treatment:  Bevacizumab ? ?INTERVAL HISTORY:  ?Saim is here today for repeat clinical assessment. He denies fevers or chills. He denies pain. His appetite is good. His weight has been stable. ? ?I have reviewed the past medical history, past surgical history, social history and family history with the patient and they are unchanged from previous note. ? ?ALLERGIES:  has No Known Allergies. ? ?MEDICATIONS:  ?Current Outpatient Medications  ?Medication Sig Dispense Refill  ? ondansetron (ZOFRAN) 4 MG tablet Take 1 tablet (4 mg total) by mouth every 4 (four) hours as needed for nausea. 90 tablet 3  ? albuterol (VENTOLIN HFA) 108 (90 Base) MCG/ACT inhaler Inhale 2 puffs into the lungs every 6 (six) hours as needed for wheezing or shortness of breath. 8 g 2  ? dexamethasone (DECADRON) 4 MG tablet Take 0.5 tablets (2 mg total) by mouth daily. 60 tablet 1  ? divalproex (DEPAKOTE) 500 MG DR tablet Take 1 tablet (500 mg total) by mouth 2 (two) times daily. 60 tablet 3  ? furosemide (LASIX) 20 MG tablet Take 1 tablet (20 mg total) by mouth daily. 5 tablet 0  ? levETIRAcetam (KEPPRA) 750 MG tablet Take 2 tablets (1,500 mg total) by mouth 2 (two) times daily. 120 tablet 2  ? lomustine (GLEOSTINE) 100 MG capsule Take 100 mg/m2 by mouth once. Take 2 capsules ('200mg'$ ) by  mouth once every 6 weeks.Take on an emtpy stomach 1 hour before or 2 hours after meals. Caution:chemotherapy    ? magic mouthwash w/lidocaine SOLN Take 5 mLs by mouth 4 (four) times daily as needed for mouth pain. 240 mL 3  ? ?No current facility-administered medications for this visit.  ? ? ?HISTORY OF PRESENT ILLNESS:  ? ?Oncology History  ?Frontal glioblastoma ([place])  ?06/15/2020 Initial Diagnosis  ? Glioblastoma multiforme of frontal lobe  ([place]) ?  ?06/15/2020 Cancer Staging  ? Staging form: Brain and Spinal Cord, AJCC 8th Edition ?- Pathologic stage from 06/15/2020: WHO Grade IV - Signed by [name], MD on 09/30/2020 ?Histopathologic type: Glioblastoma ?Stage prefix: Initial diagnosis ?Histologic grading system: 4 grade system ?Extent of surgical resection: Complete (gross) resection ?Solitary (s) or multifocal (m) tumors in the primary site: Solitary ?Seizures at presentation: Present ?Duration of symptoms before diagnosis: Short ? ?  ?09/29/2020 - 09/29/2020 Chemotherapy  ? Patient is on Treatment Plan : BRAIN GLIOBLASTOMA Consolidation Temozolomide Days 1-5 q28 Days   ?   ?08/03/2021 -  Chemotherapy  ? Patient is on Treatment Plan : BRAIN GBM Bevacizumab 14d x 6 cycles  ?   ?  ? ? ?REVIEW OF SYSTEMS:  ? ?Constitutional: Denies fevers, chills or abnormal weight loss ?Eyes: Denies blurriness of vision ?Ears, nose, mouth, throat, and face: Denies mucositis or sore throat ?Respiratory: Denies cough, dyspnea or wheezes ?Cardiovascular: Denies palpitation, chest discomfort or lower extremity swelling ?Gastrointestinal:  Denies nausea, heartburn or change in bowel habits ?Skin: Denies abnormal skin rashes ?Lymphatics: Denies new lymphadenopathy or easy bruising ?Neurological:Denies numbness, tingling or new weaknesses ?Behavioral/Psych: Mood is stable, no new changes  ?All other systems were reviewed with the patient and are negative. ? ? ?VITALS:  ?Blood pressure 117/87, pulse 92, temperature 98.2 ?F (36.8 ?C), temperature source Oral, resp. rate 20, height '6\' 4"'$  (1.93 m), weight 201 lb 4.8 oz (91.3 kg), SpO2 95 %.  ?Wt Readings from Last 3 Encounters:  ?11/07/21 205 lb 1.6 oz (93 kg)  ?10/24/21 201 lb 4.8 oz (91.3 kg)  ?10/12/21 197 lb (89.4 kg)  ?  ?Body mass index is 24.5 kg/m?. ? ?Performance status (ECOG): 1 - Symptomatic but completely ambulatory ? ?PHYSICAL EXAM:  ? ?GENERAL:alert, no distress and comfortable ?SKIN: skin color, texture,  turgor are normal, no rashes or significant lesions ?EYES: normal, Conjunctiva are pink and non-injected, sclera clear ?OROPHARYNX:no exudate, no erythema and lips, buccal mucosa, and tongue normal  ?NECK: supple, thyroid normal size, non-tender, without nodularity ?LYMPH:  no palpable lymphadenopathy in the cervical, axillary or inguinal ?LUNGS: clear to auscultation and percussion with normal breathing effort ?HEART: regular rate & rhythm and no murmurs and no lower extremity edema ?ABDOMEN:abdomen soft, non-tender and normal bowel sounds ?Musculoskeletal:no cyanosis of digits and no clubbing  ?NEURO: alert & oriented x 3 with fluent speech, no focal motor/sensory deficits ? ?LABORATORY DATA:  ?I have reviewed the data as listed ?   ?Component Value Date/Time  ? NA 142 11/07/2021 0000  ? K 3.8 11/07/2021 0000  ? CL 109 (A) 11/07/2021 0000  ? CO2 25 (A) 11/07/2021 0000  ? GLUCOSE 85 04/16/2021 0829  ? BUN 17 11/07/2021 0000  ? CREATININE 0.7 11/07/2021 0000  ? CREATININE 1.00 04/16/2021 0829  ? CALCIUM 8.9 11/07/2021 0000  ? PROT 6.8 04/16/2021 0829  ? ALBUMIN 3.6 11/07/2021 0000  ? AST 18 11/07/2021 0000  ? AST 9 (L) 04/16/2021 0829  ? ALT 23 11/07/2021  0000  ? ALT 14 04/16/2021 0829  ? ALKPHOS 57 11/07/2021 0000  ? BILITOT 0.3 04/16/2021 0829  ? GFRNONAA >60 04/16/2021 0829  ? ? ?No results found for: SPEP, UPEP ? ?Lab Results  ?Component Value Date  ? WBC 7.3 11/07/2021  ? NEUTROABS 5.77 11/07/2021  ? HGB 14.6 11/07/2021  ? HCT 43 11/07/2021  ? MCV 97 (A) 09/06/2021  ? PLT 172 11/07/2021  ? ? ?  Chemistry   ?   ?Component Value Date/Time  ? NA 142 11/07/2021 0000  ? K 3.8 11/07/2021 0000  ? CL 109 (A) 11/07/2021 0000  ? CO2 25 (A) 11/07/2021 0000  ? BUN 17 11/07/2021 0000  ? CREATININE 0.7 11/07/2021 0000  ? CREATININE 1.00 04/16/2021 0829  ? GLU 107 11/07/2021 0000  ?    ?Component Value Date/Time  ? CALCIUM 8.9 11/07/2021 0000  ? ALKPHOS 57 11/07/2021 0000  ? AST 18 11/07/2021 0000  ? AST 9 (L) 04/16/2021 0829   ? ALT 23 11/07/2021 0000  ? ALT 14 04/16/2021 0829  ? BILITOT 0.3 04/16/2021 0829  ?  ? ? ? ?RADIOGRAPHIC STUDIES: ?I have personally reviewed the radiological images as listed and agreed with the findings in the

## 2021-10-24 NOTE — Assessment & Plan Note (Signed)
Worsening right lower extremity edema with 4+ pitting. Ultrasound was negative. Weeping areas to foot noted from swelling with multiple scabbed over areas. Dr. Sherrilee Gilles aware and attributes to decreased healing due to dexamethasone and bevacizumab. He recommended weaning dexamethasone which he is currently doing. We will repeat a course of antibiotics and try a few days of lasix. He has follow up on 2/6 with Dr. Sherrilee Gilles. This edema is significantly improved today. ?

## 2021-10-24 NOTE — Assessment & Plan Note (Signed)
Grade 4 left frontal glioblastoma. ?We know he had two surgeries in November, the first to attempt biopsy and the second for resection.??He started radiation with concurrent temozolomide on December 2nd, and completed 15 fractions of radiation therapy on December 21st. ?He now has rapid recurrence with a 2.9 cm lesion in the left posterior frontal lobe. He has been on bevacizumab every 2 weeks and tolerating well. He now has continued progression as noted on MRI of the brain. Lomustine was added to his bevacizumab per Dr. Mickeal Skinner. He took his first dose 2 weeks ago without difficulty. We will schedule him for follow up MRI here according to Dr. Renda Rolls recommendations. He will proceed with bevacizumab this week.  He will return to clinic in 2 weeks for repeat evaluation.  ?

## 2021-10-25 ENCOUNTER — Telehealth: Payer: Self-pay

## 2021-10-25 NOTE — Telephone Encounter (Signed)
I spoke with pt. He is doing well. He denies N/V, diarrhea, skin rash, alopecia, mouth sores, cough, SOB and fevers. He took the Lomustine '100mg'$  cap -2 capsules on October 05, 2021. Next dose is due for 11/15/21. Pt reminded to call us if he develops temp of 100.4 or higher, day or night. He verbalized understanding. I confirmed next appt for tomorrow @ 1015 for infusion. ?

## 2021-10-26 ENCOUNTER — Inpatient Hospital Stay: Payer: Medicaid Other

## 2021-10-26 VITALS — BP 128/95 | HR 90 | Temp 98.9°F | Resp 18

## 2021-10-26 DIAGNOSIS — Z5112 Encounter for antineoplastic immunotherapy: Secondary | ICD-10-CM | POA: Diagnosis not present

## 2021-10-26 DIAGNOSIS — C711 Malignant neoplasm of frontal lobe: Secondary | ICD-10-CM

## 2021-10-26 MED ORDER — SODIUM CHLORIDE 0.9 % IV SOLN: Freq: Once | INTRAVENOUS | Status: AC

## 2021-10-26 MED ORDER — SODIUM CHLORIDE 0.9 % IV SOLN
10.0000 mg/kg | Freq: Once | INTRAVENOUS | Status: AC
  Administered 2021-10-26: 800 mg via INTRAVENOUS
  Filled 2021-10-26: qty 32

## 2021-10-26 NOTE — Patient Instructions (Signed)
[facility]  Discharge Instructions: ?Thank you for choosing [facility] to provide your oncology and hematology care.  ?If you have a lab appointment with the [facility], please go directly to the [facility] and check in at the registration area. ?  ?Wear comfortable clothing and clothing appropriate for easy access to any Portacath or PICC line.  ? ?We strive to give you quality time with your provider. You may need to reschedule your appointment if you arrive late (15 or more minutes).  Arriving late affects you and other patients whose appointments are after yours.  Also, if you miss three or more appointments without notifying the office, you may be dismissed from the clinic at the provider?s discretion.    ?  ?For prescription refill requests, have your pharmacy contact our office and allow 72 hours for refills to be completed.   ? ?Today you received the following chemotherapy and/or immunotherapy agents : Bevacizumab    ?  ?To help prevent nausea and vomiting after your treatment, we encourage you to take your nausea medication as directed. ? ?BELOW ARE SYMPTOMS THAT SHOULD BE REPORTED IMMEDIATELY: ?*FEVER GREATER THAN 100.4 F (38 ?C) OR HIGHER ?*CHILLS OR SWEATING ?*NAUSEA AND VOMITING THAT IS NOT CONTROLLED WITH YOUR NAUSEA MEDICATION ?*UNUSUAL SHORTNESS OF BREATH ?*UNUSUAL BRUISING OR BLEEDING ?*URINARY PROBLEMS (pain or burning when urinating, or frequent urination) ?*BOWEL PROBLEMS (unusual diarrhea, constipation, pain near the anus) ?TENDERNESS IN MOUTH AND THROAT WITH OR WITHOUT PRESENCE OF ULCERS (sore throat, sores in mouth, or a toothache) ?UNUSUAL RASH, SWELLING OR PAIN  ?UNUSUAL VAGINAL DISCHARGE OR ITCHING  ? ?Items with * indicate a potential emergency and should be followed up as soon as possible or go to the Emergency Department if any problems should occur. ? ?Please show the CHEMOTHERAPY ALERT CARD or IMMUNOTHERAPY ALERT CARD at check-in to the  Emergency Department and triage nurse. ? ?Should you have questions after your visit or need to cancel or reschedule your appointment, please contact [facility]  Dept: [phone]  and follow the prompts.  Office hours are 8:00 a.m. to 4:30 p.m. Monday - Friday. Please note that voicemails left after 4:00 p.m. may not be returned until the following business day.  We are closed weekends and major holidays. You have access to a nurse at all times for urgent questions. Please call the main number to the clinic Dept: [phone] and follow the prompts. ? ?For any non-urgent questions, you may also contact your provider using MyChart. We now offer e-Visits for anyone 72 and older to request care online for non-urgent symptoms. For details visit [email or web address]. ?  ?Also download the MyChart app! Go to the app store, search "MyChart", open the app, select [facility], and log in with your MyChart username and password. ? ?Due to Covid, a mask is required upon entering the hospital/clinic. If you do not have a mask, one will be given to you upon arrival. For doctor visits, patients may have 1 support person aged 73 or older with them. For treatment visits, patients cannot have anyone with them due to current Covid guidelines and our immunocompromised population.  ? ? ?

## 2021-10-31 ENCOUNTER — Encounter: Payer: Self-pay | Admitting: Oncology

## 2021-11-06 NOTE — Progress Notes (Signed)
?Patient Care Team: ?Patient, No Pcp Per (Inactive) as PCP - General (General Practice) ?Leonard Kaplan, MD as Consulting Physician (Oncology) ?[name], MD as Consulting Physician (Neurology) ? ?Clinic Day:  11/07/2021 ? ?Referring physician: [name], MD ? ?ASSESSMENT & PLAN:  ? ?Assessment & Plan: ?Frontal glioblastoma ([name]) ?Grade 4 left frontal glioblastoma.  We know he had two surgeries in November, the first to attempt biopsy and the second for resection.  He started radiation with concurrent temozolomide on December 2nd, and completed 15 fractions of radiation therapy on December 21st.  He now has rapid recurrence with a 2.9 cm lesion in the left posterior frontal lobe. He has been on bevacizumab every 2 weeks and tolerating well. He now has continued progression as noted on MRI of the brain. Lomustine was added to his bevacizumab per Dr. Mickeal Skinner. He took his first dose without difficulty. We will schedule him for follow up MRI here according to Dr. Renda Rolls recommendations. He will proceed with bevacizumab this week.  He will return to clinic in 2 weeks for repeat evaluation.  ?  ? ?The patient understands the plans discussed today and is in agreement with them.  He knows to contact our office if he develops concerns prior to his next appointment. ? ? ? ?[name], NP  ?[name] ?[address] ?[place] [address] ?Dept: [phone] ?Dept Fax: [phone]  ? ?Orders Placed This Encounter  ?Procedures  ? MR Brain W Wo Contrast  ?  Standing Status:   Future  ?  Standing Expiration Date:   11/07/2022  ?  Order Specific Question:   If indicated for the ordered procedure, I authorize the administration of contrast media per Radiology protocol  ?  Answer:   Yes  ?  Order Specific Question:   What is the patient's sedation requirement?  ?  Answer:   No Sedation  ?  Order Specific Question:   Does the patient have  a pacemaker or implanted devices?  ?  Answer:   No  ?  Order Specific Question:   Use SRS Protocol?  ?  Answer:   Yes  ?  Order Specific Question:   Preferred imaging location?  ?  Answer:   External  ?  ? ? ?CHIEF COMPLAINT:  ?CC: A 56 year old male with history of glioblastoma here for 2 week evaluation ? ?Current Treatment:  Bevacizumab ? ?INTERVAL HISTORY:  ?Leonard [name] is here today for repeat clinical assessment. He denies fevers or chills. He denies pain. His appetite is good. His weight has been stable. ? ?I have reviewed the past medical history, past surgical history, social history and family history with the patient and they are unchanged from previous note. ? ?ALLERGIES:  has No Known Allergies. ? ?MEDICATIONS:  ?Current Outpatient Medications  ?Medication Sig Dispense Refill  ? ondansetron (ZOFRAN) 4 MG tablet Take 1 tablet (4 mg total) by mouth every 4 (four) hours as needed for nausea. 90 tablet 3  ? albuterol (VENTOLIN HFA) 108 (90 Base) MCG/ACT inhaler Inhale 2 puffs into the lungs every 6 (six) hours as needed for wheezing or shortness of breath. 8 g 2  ? dexamethasone (DECADRON) 4 MG tablet Take 0.5 tablets (2 mg total) by mouth daily. 60 tablet 1  ? divalproex (DEPAKOTE) 500 MG DR tablet Take 1 tablet (500 mg total) by mouth 2 (two) times daily. 60 tablet 3  ? furosemide (LASIX) 20  MG tablet Take 1 tablet (20 mg total) by mouth daily. 5 tablet 0  ? levETIRAcetam (KEPPRA) 750 MG tablet Take 2 tablets (1,500 mg total) by mouth 2 (two) times daily. 120 tablet 2  ? lomustine (GLEOSTINE) 100 MG capsule Take 100 mg/m2 by mouth once. Take 2 capsules ('200mg'$ ) by mouth once every 6 weeks.Take on an emtpy stomach 1 hour before or 2 hours after meals. Caution:chemotherapy    ? magic mouthwash w/lidocaine SOLN Take 5 mLs by mouth 4 (four) times daily as needed for mouth pain. 240 mL 3  ? ?No current facility-administered medications for this visit.  ? ? ?HISTORY OF PRESENT ILLNESS:  ? ?Oncology History  ?Frontal  glioblastoma ([facility])  ?06/15/2020 Initial Diagnosis  ? 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[facility]) ?  ?06/15/2020 Cancer Staging  ? Staging form: Brain and Spinal Cord, AJCC 8th Edition ?- Pathologic stage from 06/15/2020: WHO Grade IV - Signed by [name], MD on 09/30/2020 ?Histopathologic type: Glioblastoma ?Stage prefix: Initial diagnosis ?Histologic grading system: 4 grade system ?Extent of surgical resection: Complete (gross) resection ?Solitary (s) or multifocal (m) tumors in the primary site: Solitary ?Seizures at presentation: Present ?Duration of symptoms before diagnosis: Short ? ?  ?09/29/2020 - 09/29/2020 Chemotherapy  ? Patient is on Treatment Plan : BRAIN GLIOBLASTOMA Consolidation Temozolomide Days 1-5 q28 Days   ?   ?08/03/2021 -  Chemotherapy  ? Patient is on Treatment Plan : BRAIN GBM Bevacizumab 14d x 6 cycles  ?   ?  ? ? ?REVIEW OF SYSTEMS:  ? ?Constitutional: Denies fevers, chills or abnormal weight loss ?Eyes: Denies blurriness of vision ?Ears, nose, mouth, throat, and face: Denies mucositis or sore throat ?Respiratory: Denies cough, dyspnea or wheezes ?Cardiovascular: Denies palpitation, chest discomfort or lower extremity swelling ?Gastrointestinal:  Denies nausea, heartburn or change in bowel habits ?Skin: Denies abnormal skin rashes ?Lymphatics: Denies new lymphadenopathy or easy bruising ?Neurological:Denies numbness, tingling or new weaknesses ?Behavioral/Psych: Mood is stable, no new changes  ?All other systems were reviewed with the patient and are negative. ? ? ?VITALS:  ?There were no vitals taken for this visit.  ?Wt Readings from Last 3 Encounters:  ?10/24/21 201 lb 4.8 oz (91.3 kg)  ?10/12/21 197 lb (89.4 kg)  ?10/10/21 199 lb 14.4 oz (90.7 kg)  ?  ?There is no height or weight on file to calculate BMI. ? ?Performance status (ECOG): 1 - Symptomatic but completely ambulatory ? ?PHYSICAL EXAM:  ? ?GENERAL:alert, no distress and comfortable ?SKIN: skin color, texture, turgor are  normal, no rashes or significant lesions ?EYES: normal, Conjunctiva are pink and non-injected, sclera clear ?OROPHARYNX:no exudate, no erythema and lips, buccal mucosa, and tongue normal  ?NECK: supple, thyroid normal size, non-tender, without nodularity ?LYMPH:  no palpable lymphadenopathy in the cervical, axillary or inguinal ?LUNGS: clear to auscultation and percussion with normal breathing effort ?HEART: regular rate & rhythm and no murmurs and no lower extremity edema ?ABDOMEN:abdomen soft, non-tender and normal bowel sounds ?Musculoskeletal:no cyanosis of digits and no clubbing  ?NEURO: alert & oriented x 3 with fluent speech, no focal motor/sensory deficits ? ?LABORATORY DATA:  ?I have reviewed the data as listed ?   ?Component Value Date/Time  ? NA 142 11/07/2021 0000  ? K 3.8 11/07/2021 0000  ? CL 109 (A) 11/07/2021 0000  ? CO2 25 (A) 11/07/2021 0000  ? GLUCOSE 85 04/16/2021 0829  ? BUN 17 11/07/2021 0000  ? CREATININE 0.7 11/07/2021 0000  ? CREATININE 1.00 04/16/2021 0829  ?  CALCIUM 8.9 11/07/2021 0000  ? PROT 6.8 04/16/2021 0829  ? ALBUMIN 3.6 11/07/2021 0000  ? AST 18 11/07/2021 0000  ? AST 9 (L) 04/16/2021 0829  ? ALT 23 11/07/2021 0000  ? ALT 14 04/16/2021 0829  ? ALKPHOS 57 11/07/2021 0000  ? BILITOT 0.3 04/16/2021 0829  ? GFRNONAA >60 04/16/2021 0829  ? ? ?No results found for: SPEP, UPEP ? ?Lab Results  ?Component Value Date  ? WBC 7.3 11/07/2021  ? NEUTROABS 5.77 11/07/2021  ? HGB 14.6 11/07/2021  ? HCT 43 11/07/2021  ? MCV 97 (A) 09/06/2021  ? PLT 172 11/07/2021  ? ? ?  Chemistry   ?   ?Component Value Date/Time  ? NA 142 11/07/2021 0000  ? K 3.8 11/07/2021 0000  ? CL 109 (A) 11/07/2021 0000  ? CO2 25 (A) 11/07/2021 0000  ? BUN 17 11/07/2021 0000  ? CREATININE 0.7 11/07/2021 0000  ? CREATININE 1.00 04/16/2021 0829  ? GLU 107 11/07/2021 0000  ?    ?Component Value Date/Time  ? CALCIUM 8.9 11/07/2021 0000  ? ALKPHOS 57 11/07/2021 0000  ? AST 18 11/07/2021 0000  ? AST 9 (L) 04/16/2021 0829  ? ALT 23  11/07/2021 0000  ? ALT 14 04/16/2021 0829  ? BILITOT 0.3 04/16/2021 0829  ?  ? ? ? ?RADIOGRAPHIC STUDIES: ?I have personally reviewed the radiological images as listed and agreed with the findings in

## 2021-11-07 ENCOUNTER — Inpatient Hospital Stay: Payer: Medicaid Other | Attending: Internal Medicine | Admitting: Hematology and Oncology

## 2021-11-07 ENCOUNTER — Encounter: Payer: Self-pay | Admitting: Oncology

## 2021-11-07 ENCOUNTER — Inpatient Hospital Stay: Payer: Medicaid Other

## 2021-11-07 ENCOUNTER — Encounter: Payer: Self-pay | Admitting: Hematology and Oncology

## 2021-11-07 VITALS — BP 136/79 | HR 95 | Temp 99.0°F | Resp 18 | Ht 76.0 in | Wt 205.1 lb

## 2021-11-07 DIAGNOSIS — Z923 Personal history of irradiation: Secondary | ICD-10-CM | POA: Diagnosis not present

## 2021-11-07 DIAGNOSIS — C711 Malignant neoplasm of frontal lobe: Secondary | ICD-10-CM

## 2021-11-07 DIAGNOSIS — R569 Unspecified convulsions: Secondary | ICD-10-CM | POA: Insufficient documentation

## 2021-11-07 DIAGNOSIS — Z79899 Other long term (current) drug therapy: Secondary | ICD-10-CM | POA: Diagnosis not present

## 2021-11-07 DIAGNOSIS — Z9221 Personal history of antineoplastic chemotherapy: Secondary | ICD-10-CM | POA: Diagnosis not present

## 2021-11-07 DIAGNOSIS — R609 Edema, unspecified: Secondary | ICD-10-CM | POA: Insufficient documentation

## 2021-11-07 DIAGNOSIS — Z5112 Encounter for antineoplastic immunotherapy: Secondary | ICD-10-CM | POA: Diagnosis present

## 2021-11-07 DIAGNOSIS — Z7952 Long term (current) use of systemic steroids: Secondary | ICD-10-CM | POA: Diagnosis not present

## 2021-11-07 LAB — HEPATIC FUNCTION PANEL
ALT: 23 U/L (ref 10–40)
AST: 18 (ref 14–40)
Alkaline Phosphatase: 57 (ref 25–125)
Bilirubin, Total: 0.5

## 2021-11-07 LAB — BASIC METABOLIC PANEL
BUN: 17 (ref 4–21)
CO2: 25 — AB (ref 13–22)
Chloride: 109 — AB (ref 99–108)
Creatinine: 0.7 (ref 0.6–1.3)
Glucose: 107
Potassium: 3.8 mEq/L (ref 3.5–5.1)
Sodium: 142 (ref 137–147)

## 2021-11-07 LAB — TOTAL PROTEIN, URINE DIPSTICK: Protein, ur: NEGATIVE mg/dL

## 2021-11-07 LAB — CBC: RBC: 4.51 (ref 3.87–5.11)

## 2021-11-07 LAB — COMPREHENSIVE METABOLIC PANEL
Albumin: 3.6 (ref 3.5–5.0)
Calcium: 8.9 (ref 8.7–10.7)

## 2021-11-07 LAB — CBC AND DIFFERENTIAL
HCT: 43 (ref 41–53)
Hemoglobin: 14.6 (ref 13.5–17.5)
Neutrophils Absolute: 5.77
Platelets: 172 10*3/uL (ref 150–400)
WBC: 7.3

## 2021-11-07 NOTE — Assessment & Plan Note (Signed)
Grade 4 left frontal glioblastoma. ?We know he had two surgeries in November, the first to attempt biopsy and the second for resection.??He started radiation with concurrent temozolomide on December 2nd, and completed 15 fractions of radiation therapy on December 21st. ?He now has rapid recurrence with a 2.9 cm lesion in the left posterior frontal lobe. He has been on bevacizumab every 2 weeks and tolerating well. He now has continued progression as noted on MRI of the brain. Lomustine was added to his bevacizumab per Dr. Mickeal Skinner. He took his first dose without difficulty. We will schedule him for follow up MRI here according to Dr. Renda Rolls recommendations. He will proceed with bevacizumab this week.  He will return to clinic in 2 weeks for repeat evaluation.  ?

## 2021-11-08 ENCOUNTER — Encounter: Payer: Self-pay | Admitting: Oncology

## 2021-11-08 ENCOUNTER — Other Ambulatory Visit: Payer: Self-pay | Admitting: Pharmacist

## 2021-11-09 ENCOUNTER — Inpatient Hospital Stay: Payer: Medicaid Other

## 2021-11-09 VITALS — BP 121/86 | HR 95 | Temp 97.8°F | Resp 20 | Wt 207.0 lb

## 2021-11-09 DIAGNOSIS — Z5112 Encounter for antineoplastic immunotherapy: Secondary | ICD-10-CM | POA: Diagnosis not present

## 2021-11-09 DIAGNOSIS — C711 Malignant neoplasm of frontal lobe: Secondary | ICD-10-CM

## 2021-11-09 MED ORDER — SODIUM CHLORIDE 0.9 % IV SOLN
10.0000 mg/kg | Freq: Once | INTRAVENOUS | Status: AC
  Administered 2021-11-09: 900 mg via INTRAVENOUS
  Filled 2021-11-09: qty 32

## 2021-11-09 MED ORDER — SODIUM CHLORIDE 0.9 % IV SOLN: Freq: Once | INTRAVENOUS | Status: AC

## 2021-11-09 NOTE — Patient Instructions (Signed)
[facility]  Discharge Instructions: ?Thank you for choosing [facility] to provide your oncology and hematology care.  ?If you have a lab appointment with the [facility], please go directly to the [facility] and check in at the registration area. ?  ?Wear comfortable clothing and clothing appropriate for easy access to any Portacath or PICC line.  ? ?We strive to give you quality time with your provider. You may need to reschedule your appointment if you arrive late (15 or more minutes).  Arriving late affects you and other patients whose appointments are after yours.  Also, if you miss three or more appointments without notifying the office, you may be dismissed from the clinic at the provider?s discretion.    ?  ?For prescription refill requests, have your pharmacy contact our office and allow 72 hours for refills to be completed.   ? ?Today you received the following chemotherapy and/or immunotherapy agents : Bevacizumab    ?  ?To help prevent nausea and vomiting after your treatment, we encourage you to take your nausea medication as directed. ? ?BELOW ARE SYMPTOMS THAT SHOULD BE REPORTED IMMEDIATELY: ?*FEVER GREATER THAN 100.4 F (38 ?C) OR HIGHER ?*CHILLS OR SWEATING ?*NAUSEA AND VOMITING THAT IS NOT CONTROLLED WITH YOUR NAUSEA MEDICATION ?*UNUSUAL SHORTNESS OF BREATH ?*UNUSUAL BRUISING OR BLEEDING ?*URINARY PROBLEMS (pain or burning when urinating, or frequent urination) ?*BOWEL PROBLEMS (unusual diarrhea, constipation, pain near the anus) ?TENDERNESS IN MOUTH AND THROAT WITH OR WITHOUT PRESENCE OF ULCERS (sore throat, sores in mouth, or a toothache) ?UNUSUAL RASH, SWELLING OR PAIN  ?UNUSUAL VAGINAL DISCHARGE OR ITCHING  ? ?Items with * indicate a potential emergency and should be followed up as soon as possible or go to the Emergency Department if any problems should occur. ? ?Please show the CHEMOTHERAPY ALERT CARD or IMMUNOTHERAPY ALERT CARD at check-in to the  Emergency Department and triage nurse. ? ?Should you have questions after your visit or need to cancel or reschedule your appointment, please contact [facility]  Dept: [phone]  and follow the prompts.  Office hours are 8:00 a.m. to 4:30 p.m. Monday - Friday. Please note that voicemails left after 4:00 p.m. may not be returned until the following business day.  We are closed weekends and major holidays. You have access to a nurse at all times for urgent questions. Please call the main number to the clinic Dept: [phone] and follow the prompts. ? ?For any non-urgent questions, you may also contact your provider using MyChart. We now offer e-Visits for anyone 94 and older to request care online for non-urgent symptoms. For details visit [email or web address]. ?  ?Also download the MyChart app! Go to the app store, search "MyChart", open the app, select [facility], and log in with your MyChart username and password. ? ?Due to Covid, a mask is required upon entering the hospital/clinic. If you do not have a mask, one will be given to you upon arrival. For doctor visits, patients may have 1 support person aged 57 or older with them. For treatment visits, patients cannot have anyone with them due to current Covid guidelines and our immunocompromised population.  ? ? ?

## 2021-11-21 ENCOUNTER — Other Ambulatory Visit: Payer: Self-pay

## 2021-11-21 ENCOUNTER — Inpatient Hospital Stay: Payer: Medicaid Other

## 2021-11-21 ENCOUNTER — Encounter: Payer: Self-pay | Admitting: Hematology and Oncology

## 2021-11-21 ENCOUNTER — Inpatient Hospital Stay (INDEPENDENT_AMBULATORY_CARE_PROVIDER_SITE_OTHER): Payer: Medicaid Other | Admitting: Hematology and Oncology

## 2021-11-21 DIAGNOSIS — R609 Edema, unspecified: Secondary | ICD-10-CM

## 2021-11-21 DIAGNOSIS — C711 Malignant neoplasm of frontal lobe: Secondary | ICD-10-CM | POA: Diagnosis not present

## 2021-11-21 DIAGNOSIS — Z5112 Encounter for antineoplastic immunotherapy: Secondary | ICD-10-CM | POA: Diagnosis not present

## 2021-11-21 LAB — TOTAL PROTEIN, URINE DIPSTICK: Protein, ur: NEGATIVE mg/dL

## 2021-11-21 LAB — CBC AND DIFFERENTIAL
HCT: 42 (ref 41–53)
Hemoglobin: 13.9 (ref 13.5–17.5)
Neutrophils Absolute: 1.85
Platelets: 331 10*3/uL (ref 150–400)
WBC: 4.2

## 2021-11-21 LAB — BASIC METABOLIC PANEL
BUN: 5 (ref 4–21)
CO2: 32 — AB (ref 13–22)
Chloride: 99 (ref 99–108)
Creatinine: 1 (ref 0.6–1.3)
Glucose: 104
Potassium: 3.9 mEq/L (ref 3.5–5.1)
Sodium: 139 (ref 137–147)

## 2021-11-21 LAB — COMPREHENSIVE METABOLIC PANEL
Albumin: 3.7 (ref 3.5–5.0)
Calcium: 9.5 (ref 8.7–10.7)

## 2021-11-21 LAB — CBC: RBC: 4.31 (ref 3.87–5.11)

## 2021-11-21 LAB — HEPATIC FUNCTION PANEL
ALT: 20 U/L (ref 10–40)
AST: 21 (ref 14–40)
Alkaline Phosphatase: 68 (ref 25–125)
Bilirubin, Total: 0.4

## 2021-11-21 NOTE — Assessment & Plan Note (Signed)
He has had off and on edema, mainly of the right lower extremity and foot. This swelling, at times results in weeping areas noted to his skin. Venous doppler ultrasound studies in the past have been negative. We did place him on an antibiotic and weaned him off of his decadron and there was concern the steroids were delaying healing. Last week, he had minimal swelling. Today it is somewhat increased and he notes needing assistance to put on his right sock and shoe. There is no associated wounds or weeping noted to the skin.  We will refill his lasix for the next few days to see if that helps. He also continues with physical therapy as his mobility allows.  ?

## 2021-11-21 NOTE — Assessment & Plan Note (Addendum)
Grade 4 left frontal glioblastoma. ?We know he had two surgeries in November, the first to attempt biopsy and the second for resection.??He started radiation with concurrent temozolomide on December 2nd, and completed 15 fractions of radiation therapy on December 21st. ?He had rapid recurrence with a 2.9 cm lesion in the left posterior frontal lobe. He has been on bevacizumab every 2 weeks and tolerating well. He then had continued progression as noted on MRI of the brain. Lomustine was added to his bevacizumab per Dr. Mickeal Skinner. He took his first dose without difficulty. He took his second dose of lomustine last week and seems to have tolerated it well with some increasing fatigue. He will have MRI brain in the next few days and follow up with Dr. Mickeal Skinner on 04/27. CBC and CMP are unremarkable today. We will see him back in clinic in 2 weeks after his evaluation with Dr. Mickeal Skinner. He will proceed with cycle 8 bevacizumab this week.  ?

## 2021-11-21 NOTE — Progress Notes (Signed)
?Patient Care Team: ?Patient, No Pcp Per (Inactive) as PCP - General (General Practice) ?Leonard Kaplan, MD as Consulting Physician (Oncology) ?Leonard Sellers, MD as Consulting Physician (Neurology) ? ?Clinic Day:  11/21/2021 ? ?Referring physician: [name], MD ? ?ASSESSMENT & PLAN:  ? ?Assessment & Plan: ?Frontal glioblastoma ([facility] [place]) ?Grade 4 left frontal glioblastoma.  We know he had two surgeries in November, the first to attempt biopsy and the second for resection.  He started radiation with concurrent temozolomide on December 2nd, and completed 15 fractions of radiation therapy on December 21st.  He had rapid recurrence with a 2.9 cm lesion in the left posterior frontal lobe. He has been on bevacizumab every 2 weeks and tolerating well. He then had continued progression as noted on MRI of the brain. Lomustine was added to his bevacizumab per Dr. Mickeal [name]. He took his first dose without difficulty. He took his second dose of lomustine last week and seems to have tolerated it well with some increasing fatigue. He will have MRI brain in the next few days and follow up with Dr. Mickeal [name] on 04/27. CBC and CMP are unremarkable today. We will see him back in clinic in 2 weeks after his evaluation with Dr. Mickeal [name]. He will proceed with cycle 8 bevacizumab this week.  ? ?Edema ?He has had off and on edema, mainly of the right lower extremity and foot. This swelling, at times results in weeping areas noted to his skin. Venous doppler ultrasound studies in the past have been negative. We did place him on an antibiotic and weaned him off of his decadron and there was concern the steroids were delaying healing. Last week, he had minimal swelling. Today it is somewhat increased and he notes needing assistance to put on his right sock and shoe. There is no associated wounds or weeping noted to the skin.  We will refill his lasix for the next few days to see if that helps. He also continues with physical therapy  as his mobility allows.   ? ?The patient understands the plans discussed today and is in agreement with them.  He knows to contact our office if he develops concerns prior to his next appointment. ? ? ? ?[name], NP  ?[place] ?[place] [address] ?Dept: [phone] ?Dept Fax: [phone]  ? ?Orders Placed This Encounter  ?Procedures  ? CBC and differential  ?  This external order was created through the Results Console.  ? CBC  ?  This external order was created through the Results Console.  ? Basic metabolic panel  ?  This external order was created through the Results Console.  ? Comprehensive metabolic panel  ?  This external order was created through the Results Console.  ? Hepatic function panel  ?  This external order was created through the Results Console.  ?  ? ? ?CHIEF COMPLAINT:  ?CC: A 56 year old male with history of frontal glioblastoma here for 2 week evaluation ? ?Current Treatment:  Bevacizumab with Lomustine ? ?INTERVAL HISTORY:  ?Leonard [name] is here today for repeat clinical assessment. He denies fevers or chills. He denies pain. His appetite is good. His weight has been stable. ? ?I have reviewed the past medical history, past surgical history, social history and family history with the patient and they are unchanged from previous note. ? ?ALLERGIES:  has No Known Allergies. ? ?MEDICATIONS:  ?Current Outpatient Medications  ?Medication  Sig Dispense Refill  ? ondansetron (ZOFRAN) 4 MG tablet Take 1 tablet (4 mg total) by mouth every 4 (four) hours as needed for nausea. 90 tablet 3  ? albuterol (VENTOLIN HFA) 108 (90 Base) MCG/ACT inhaler Inhale 2 puffs into the lungs every 6 (six) hours as needed for wheezing or shortness of breath. 8 g 2  ? dexamethasone (DECADRON) 4 MG tablet Take 0.5 tablets (2 mg total) by mouth daily. 60 tablet 1  ? divalproex (DEPAKOTE) 500 MG DR tablet Take 1 tablet (500 mg total) by  mouth 2 (two) times daily. 60 tablet 3  ? furosemide (LASIX) 20 MG tablet Take 1 tablet (20 mg total) by mouth daily. 5 tablet 0  ? levETIRAcetam (KEPPRA) 750 MG tablet Take 2 tablets (1,500 mg total) by mouth 2 (two) times daily. 120 tablet 2  ? lomustine (GLEOSTINE) 100 MG capsule Take 100 mg/m2 by mouth once. Take 2 capsules ('200mg'$ ) by mouth once every 6 weeks.Take on an emtpy stomach 1 hour before or 2 hours after meals. Caution:chemotherapy    ? magic mouthwash w/lidocaine SOLN Take 5 mLs by mouth 4 (four) times daily as needed for mouth pain. 240 mL 3  ? ?No current facility-administered medications for this visit.  ? ? ?HISTORY OF PRESENT ILLNESS:  ? ?Oncology History  ?Frontal glioblastoma ([facility])  ?06/15/2020 Initial Diagnosis  ? 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[facility]) ? ?  ?06/15/2020 Cancer Staging  ? Staging form: Brain and Spinal Cord, AJCC 8th Edition ?- Pathologic stage from 06/15/2020: WHO Grade IV - Signed by Leonard Sellers, MD on 09/30/2020 ?Histopathologic type: Glioblastoma ?Stage prefix: Initial diagnosis ?Histologic grading system: 4 grade system ?Extent of surgical resection: Complete (gross) resection ?Solitary (s) or multifocal (m) tumors in the primary site: Solitary ?Seizures at presentation: Present ?Duration of symptoms before diagnosis: Short ? ?  ?09/29/2020 - 09/29/2020 Chemotherapy  ? Patient is on Treatment Plan : BRAIN GLIOBLASTOMA Consolidation Temozolomide Days 1-5 q28 Days   ? ?  ?  ?08/03/2021 -  Chemotherapy  ? Patient is on Treatment Plan : BRAIN GBM Bevacizumab 14d x 6 cycles  ? ?  ?  ?  ? ? ?REVIEW OF SYSTEMS:  ? ?Constitutional: Denies fevers, chills or abnormal weight loss ?Eyes: Denies blurriness of vision ?Ears, nose, mouth, throat, and face: Denies mucositis or sore throat ?Respiratory: Denies cough, dyspnea or wheezes ?Cardiovascular: Denies palpitation, chest discomfort or lower extremity swelling ?Gastrointestinal:  Denies nausea, heartburn or change in bowel  habits ?Skin: Denies abnormal skin rashes ?Lymphatics: Denies new lymphadenopathy or easy bruising ?Neurological:Denies numbness, tingling or new weaknesses ?Behavioral/Psych: Mood is stable, no new changes  ?All other systems were reviewed with the patient and are negative. ? ? ?VITALS:  ?Blood pressure 109/70, pulse (!) 101, temperature 98.6 ?F (37 ?C), temperature source Oral, resp. rate 18, height '6\' 4"'$  (1.93 m), weight 193 lb 1.6 oz (87.6 kg), SpO2 94 %.  ?Wt Readings from Last 3 Encounters:  ?11/21/21 193 lb 1.6 oz (87.6 kg)  ?11/09/21 207 lb (93.9 kg)  ?11/07/21 205 lb 1.6 oz (93 kg)  ?  ?Body mass index is 23.5 kg/m?. ? ?Performance status (ECOG): 1 - Symptomatic but completely ambulatory ? ?PHYSICAL EXAM:  ? ?GENERAL:alert, no distress and comfortable ?SKIN: skin color, texture, turgor are normal, no rashes or significant lesions ?EYES: normal, Conjunctiva are pink and non-injected, sclera clear ?OROPHARYNX:no exudate, no erythema and lips, buccal mucosa, and tongue normal  ?NECK: supple, thyroid normal size, non-tender,  without nodularity ?LYMPH:  no palpable lymphadenopathy in the cervical, axillary or inguinal ?LUNGS: clear to auscultation and percussion with normal breathing effort ?HEART: regular rate & rhythm and no murmurs and no lower extremity edema ?ABDOMEN:abdomen soft, non-tender and normal bowel sounds ?Musculoskeletal:no cyanosis of digits and no clubbing  ?NEURO: alert & oriented x 3 with fluent speech, no focal motor/sensory deficits ? ?LABORATORY DATA:  ?I have reviewed the data as listed ?   ?Component Value Date/Time  ? NA 139 11/21/2021 0000  ? K 3.9 11/21/2021 0000  ? CL 99 11/21/2021 0000  ? CO2 32 (A) 11/21/2021 0000  ? GLUCOSE 85 04/16/2021 0829  ? BUN 5 11/21/2021 0000  ? CREATININE 1.0 11/21/2021 0000  ? CREATININE 1.00 04/16/2021 0829  ? CALCIUM 9.5 11/21/2021 0000  ? PROT 6.8 04/16/2021 0829  ? ALBUMIN 3.7 11/21/2021 0000  ? AST 21 11/21/2021 0000  ? AST 9 (L) 04/16/2021 0829  ?  ALT 20 11/21/2021 0000  ? ALT 14 04/16/2021 0829  ? ALKPHOS 68 11/21/2021 0000  ? BILITOT 0.3 04/16/2021 0829  ? GFRNONAA >60 04/16/2021 0829  ? ? ?No results found for: SPEP, UPEP ? ?Lab Results  ?Co

## 2021-11-22 ENCOUNTER — Telehealth: Payer: Self-pay | Admitting: Hematology and Oncology

## 2021-11-22 ENCOUNTER — Telehealth: Payer: Self-pay

## 2021-11-22 NOTE — Telephone Encounter (Addendum)
He was in clinic on the 19th, but I didn't get chance to speak to him. Pt had 14# weight loss in last 2 weeks, increased fatigue, and has weakness in his right arm and lower leg per documentation from yesterday.  ? ?4/21- I spoke with Levada Dy today. He reports that he took the Lomustine on an empty stomach in the evening, last week. Pt denies N/V, abdominal pain, diarrhea, skin rash/itching, SOB, headache and blurred vision. I reminded pt of the importance in calling us if he develops temp of 100.4 or higher, day or night. Pt verbalized understanding. I also confirmed pt's next appt here on 12/05/2021. ?

## 2021-11-22 NOTE — Telephone Encounter (Signed)
Patient has been scheduled for follow-up visit per 11/21/21 los. Pt given appt information over the phone. ?

## 2021-11-23 ENCOUNTER — Inpatient Hospital Stay: Payer: Medicaid Other

## 2021-11-23 ENCOUNTER — Encounter: Payer: Self-pay | Admitting: Oncology

## 2021-11-23 VITALS — BP 111/83 | HR 80 | Temp 98.1°F | Resp 18 | Ht 76.0 in | Wt 200.6 lb

## 2021-11-23 DIAGNOSIS — C711 Malignant neoplasm of frontal lobe: Secondary | ICD-10-CM

## 2021-11-23 DIAGNOSIS — Z5112 Encounter for antineoplastic immunotherapy: Secondary | ICD-10-CM | POA: Diagnosis not present

## 2021-11-23 MED ORDER — SODIUM CHLORIDE 0.9 % IV SOLN: Freq: Once | INTRAVENOUS | Status: AC

## 2021-11-23 MED ORDER — HEPARIN SOD (PORK) LOCK FLUSH 100 UNIT/ML IV SOLN: 500.0000 [IU] | Freq: Once | INTRAVENOUS | Status: DC | PRN

## 2021-11-23 MED ORDER — SODIUM CHLORIDE 0.9 % IV SOLN
10.0000 mg/kg | Freq: Once | INTRAVENOUS | Status: AC
  Administered 2021-11-23: 900 mg via INTRAVENOUS
  Filled 2021-11-23: qty 32

## 2021-11-23 MED ORDER — SODIUM CHLORIDE 0.9% FLUSH: 10.0000 mL | INTRAVENOUS | Status: DC | PRN

## 2021-11-23 NOTE — Patient Instructions (Signed)
[facility]  Discharge Instructions: ?Thank you for choosing [facility] to provide your oncology and hematology care.  ?If you have a lab appointment with the [facility], please go directly to the [facility] and check in at the registration area. ?  ?Wear comfortable clothing and clothing appropriate for easy access to any Portacath or PICC line.  ? ?We strive to give you quality time with your provider. You may need to reschedule your appointment if you arrive late (15 or more minutes).  Arriving late affects you and other patients whose appointments are after yours.  Also, if you miss three or more appointments without notifying the office, you may be dismissed from the clinic at the provider?s discretion.    ?  ?For prescription refill requests, have your pharmacy contact our office and allow 72 hours for refills to be completed.   ? ?Today you received the following chemotherapy and/or immunotherapy agents Bevacizumab   ?  ?To help prevent nausea and vomiting after your treatment, we encourage you to take your nausea medication as directed. ? ?BELOW ARE SYMPTOMS THAT SHOULD BE REPORTED IMMEDIATELY: ?*FEVER GREATER THAN 100.4 F (38 ?C) OR HIGHER ?*CHILLS OR SWEATING ?*NAUSEA AND VOMITING THAT IS NOT CONTROLLED WITH YOUR NAUSEA MEDICATION ?*UNUSUAL SHORTNESS OF BREATH ?*UNUSUAL BRUISING OR BLEEDING ?*URINARY PROBLEMS (pain or burning when urinating, or frequent urination) ?*BOWEL PROBLEMS (unusual diarrhea, constipation, pain near the anus) ?TENDERNESS IN MOUTH AND THROAT WITH OR WITHOUT PRESENCE OF ULCERS (sore throat, sores in mouth, or a toothache) ?UNUSUAL RASH, SWELLING OR PAIN  ?UNUSUAL VAGINAL DISCHARGE OR ITCHING  ? ?Items with * indicate a potential emergency and should be followed up as soon as possible or go to the Emergency Department if any problems should occur. ? ?Please show the CHEMOTHERAPY ALERT CARD or IMMUNOTHERAPY ALERT CARD at check-in to the  Emergency Department and triage nurse. ? ?Should you have questions after your visit or need to cancel or reschedule your appointment, please contact [facility]  Dept: [phone]  and follow the prompts.  Office hours are 8:00 a.m. to 4:30 p.m. Monday - Friday. Please note that voicemails left after 4:00 p.m. may not be returned until the following business day.  We are closed weekends and major holidays. You have access to a nurse at all times for urgent questions. Please call the main number to the clinic Dept: [phone] and follow the prompts. ? ?For any non-urgent questions, you may also contact your provider using MyChart. We now offer e-Visits for anyone 74 and older to request care online for non-urgent symptoms. For details visit [email or web address]. ?  ?Also download the MyChart app! Go to the app store, search "MyChart", open the app, select [facility], and log in with your MyChart username and password. ? ?Due to Covid, a mask is required upon entering the hospital/clinic. If you do not have a mask, one will be given to you upon arrival. For doctor visits, patients may have 1 support person aged 86 or older with them. For treatment visits, patients cannot have anyone with them due to current Covid guidelines and our immunocompromised population.  ? ? ?

## 2021-11-23 NOTE — Progress Notes (Signed)
1241: PT STABLE AT TIME OF DISCHARGE  

## 2021-11-25 ENCOUNTER — Other Ambulatory Visit: Payer: Self-pay | Admitting: Hematology and Oncology

## 2021-11-25 DIAGNOSIS — R600 Localized edema: Secondary | ICD-10-CM

## 2021-11-26 ENCOUNTER — Encounter: Payer: Self-pay | Admitting: Oncology

## 2021-11-27 ENCOUNTER — Telehealth: Payer: Self-pay | Admitting: Dietician

## 2021-11-27 NOTE — Telephone Encounter (Signed)
Nutrition Assessment ? ? ?Reason for Assessment: MST screen for weight eating poorly ? ? ?ASSESSMENT: Patient is a 56 year old male with Grade 4 left frontal glioblastoma. ? ?He is slow to respond to questions regarding how he's eating.  He states he has been trying to eat 3 meals a day. Does report lack of appetite but his weight has fluctuated and has increased. He denies other nutritional impact symptoms at this time.  He had be on Decadron which could have contributed to his weight gain. ? ? ?Usual Intake: ?Was eating three meals per day  ?Soup with crackers, sausages and crackers ?Peanut butter sandwiches, peaches, apples sauce ?Hamburger patties, tater tots and fries ?Loves pot pies and has them in your freezer, also like some mixed vegetable ?Fluids: soda, Gingerale, drinks apple juice, small bottles of water 4-5/day ? ?Tried to drink Ensure, thinks they give him diarrhea, milk also not tolerated but will have some occasional with cereal.  He doesn't like OJ so didn't recommend Vit D fortified  ? ? ?Medications: Decadron, lasix, zofran ? ?Labs: reviewed 11/21/21, no nutritional concerns ? ? ?Anthropometrics: Weight fluctuating between 193-207# ? ? ? ?Height: 76" ?Weight:  ?11/23/21   200.# ?11/21/21   193# ?11/09/21      207#    ?10/12/21    197# ?UBW: said he's never been this heavy ?BMI: 24.42 ? ?INTERVENTION:  Encouraged him to have Vit D level checked, and decrease his use of sugar sweetened beverages and drink more water.  He had no questions or concerns.  He didn't want any resources or tips. ?  ?Next Visit:  at Patient and provide request ? ? ?April Manson, RDN, LDN ?Registered Dietitian, [facility] ?Part Time Remote (Usual office hours: Tuesday-Thursday) ?Cell: [phone]   ?

## 2021-11-29 ENCOUNTER — Telehealth: Payer: Self-pay | Admitting: Internal Medicine

## 2021-11-29 ENCOUNTER — Telehealth: Payer: Self-pay

## 2021-11-29 ENCOUNTER — Other Ambulatory Visit: Payer: Self-pay | Admitting: Radiation Therapy

## 2021-11-29 ENCOUNTER — Inpatient Hospital Stay: Payer: Medicaid Other | Admitting: Internal Medicine

## 2021-11-29 NOTE — Telephone Encounter (Signed)
Per 4/27 in basket called pt for follow up appointment.  Pt confirmed appointment  ?

## 2021-11-29 NOTE — Telephone Encounter (Signed)
Pt scheduled to see Dr. Mickeal Skinner today 4/27. MRI not been scheduled or completed. Called pt and he has agreed to come to [facility] for Brain MRI on 12/07/21 at 4:30 PM for 5 PM scan. Communicated with Elliot Gault to make sure scan will be authorized. Scheduling message sent to reschedule apt with Dr. Mickeal Skinner for scan review. Added to TB on 12/10/21. ?

## 2021-11-30 ENCOUNTER — Encounter: Payer: Self-pay | Admitting: Oncology

## 2021-12-05 ENCOUNTER — Other Ambulatory Visit: Payer: Self-pay | Admitting: Hematology and Oncology

## 2021-12-05 ENCOUNTER — Inpatient Hospital Stay: Payer: Medicaid Other | Attending: Internal Medicine | Admitting: Oncology

## 2021-12-05 ENCOUNTER — Inpatient Hospital Stay (INDEPENDENT_AMBULATORY_CARE_PROVIDER_SITE_OTHER): Payer: Medicaid Other

## 2021-12-05 ENCOUNTER — Encounter: Payer: Self-pay | Admitting: Oncology

## 2021-12-05 VITALS — BP 115/84 | HR 90 | Temp 97.7°F | Resp 18 | Ht 76.0 in | Wt 193.7 lb

## 2021-12-05 DIAGNOSIS — Z79899 Other long term (current) drug therapy: Secondary | ICD-10-CM | POA: Insufficient documentation

## 2021-12-05 DIAGNOSIS — Z9221 Personal history of antineoplastic chemotherapy: Secondary | ICD-10-CM | POA: Diagnosis not present

## 2021-12-05 DIAGNOSIS — R609 Edema, unspecified: Secondary | ICD-10-CM | POA: Insufficient documentation

## 2021-12-05 DIAGNOSIS — Z5111 Encounter for antineoplastic chemotherapy: Secondary | ICD-10-CM | POA: Diagnosis present

## 2021-12-05 DIAGNOSIS — C711 Malignant neoplasm of frontal lobe: Secondary | ICD-10-CM

## 2021-12-05 DIAGNOSIS — Z5112 Encounter for antineoplastic immunotherapy: Secondary | ICD-10-CM | POA: Insufficient documentation

## 2021-12-05 DIAGNOSIS — Z803 Family history of malignant neoplasm of breast: Secondary | ICD-10-CM | POA: Diagnosis not present

## 2021-12-05 DIAGNOSIS — R531 Weakness: Secondary | ICD-10-CM | POA: Insufficient documentation

## 2021-12-05 DIAGNOSIS — R569 Unspecified convulsions: Secondary | ICD-10-CM | POA: Diagnosis not present

## 2021-12-05 DIAGNOSIS — Z923 Personal history of irradiation: Secondary | ICD-10-CM | POA: Diagnosis not present

## 2021-12-05 LAB — CBC AND DIFFERENTIAL
HCT: 46 (ref 41–53)
Hemoglobin: 15 (ref 13.5–17.5)
Neutrophils Absolute: 3.64
Platelets: 303 10*3/uL (ref 150–400)
WBC: 5.6

## 2021-12-05 LAB — HEPATIC FUNCTION PANEL
ALT: 19 U/L (ref 10–40)
AST: 19 (ref 14–40)
Alkaline Phosphatase: 52 (ref 25–125)
Bilirubin, Total: 0.3

## 2021-12-05 LAB — BASIC METABOLIC PANEL
BUN: 10 (ref 4–21)
CO2: 29 — AB (ref 13–22)
Chloride: 107 (ref 99–108)
Creatinine: 0.7 (ref 0.6–1.3)
Glucose: 124
Potassium: 4.1 mEq/L (ref 3.5–5.1)
Sodium: 143 (ref 137–147)

## 2021-12-05 LAB — COMPREHENSIVE METABOLIC PANEL
Albumin: 4.1 (ref 3.5–5.0)
Calcium: 9.4 (ref 8.7–10.7)

## 2021-12-05 LAB — TOTAL PROTEIN, URINE DIPSTICK: Protein, ur: NEGATIVE mg/dL

## 2021-12-05 LAB — CBC
MCV: 98 — AB (ref 80–94)
RBC: 4.69 (ref 3.87–5.11)

## 2021-12-06 ENCOUNTER — Encounter: Payer: Self-pay | Admitting: Oncology

## 2021-12-06 ENCOUNTER — Other Ambulatory Visit: Payer: Self-pay | Admitting: Oncology

## 2021-12-07 ENCOUNTER — Ambulatory Visit (HOSPITAL_COMMUNITY)
Admission: RE | Admit: 2021-12-07 | Discharge: 2021-12-07 | Disposition: A | Discharge status: Home / Self Care | Payer: Medicaid Other | Source: Ambulatory Visit | Attending: Hematology and Oncology | Admitting: Hematology and Oncology

## 2021-12-07 ENCOUNTER — Inpatient Hospital Stay: Payer: Medicaid Other

## 2021-12-07 DIAGNOSIS — C711 Malignant neoplasm of frontal lobe: Secondary | ICD-10-CM

## 2021-12-07 IMAGING — MR MR HEAD WO/W CM
15 series · 48 of 48 positions shown · IV contrast (gadavist)
Comparison: [DATE]

CLINICAL DATA: Brain/CNS neoplasm, monitor glioblastoma;
glioblastoma

EXAM:
MRI HEAD WITHOUT AND WITH CONTRAST
TECHNIQUE: Multiplanar, multiecho pulse sequences of the brain and surrounding
structures were obtained without and with intravenous contrast.
CONTRAST:  8mL GADAVIST GADOBUTROL 1 MMOL/ML IV SOLN

[Series 5: DWI · axial · 3.0mm · 1.36mm/px · z∈[-26,+123]mm · 6 of 103 slices shown (1 of 2)]
[im 1/103]
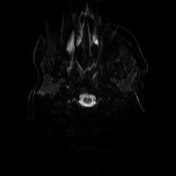
[im 21/103]
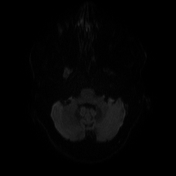
[im 41/103]
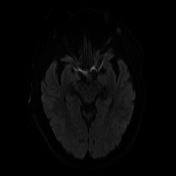
[im 62/103]
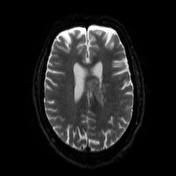
[im 82/103]
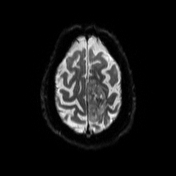
[im 103/103]
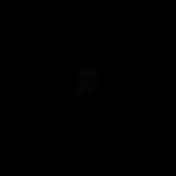

[Series 6: DWI · axial · 3.0mm · 1.36mm/px · z∈[-26,+123]mm · 3 of 52 slices shown (2 of 2)]
[im 1/52]
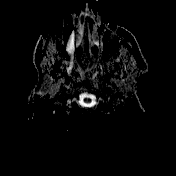
[im 26/52]
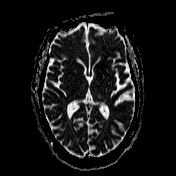
[im 52/52]
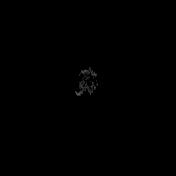

[Series 7: T1 · sagittal · 5.0mm · 0.75mm/px · 1 of 24 slices shown (1 of 4)]
[im 1/24]
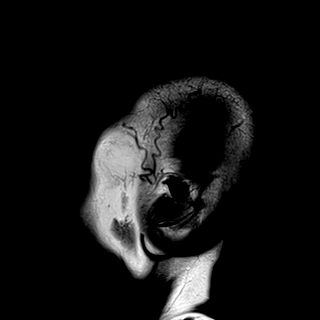

[Series 8: T2 · axial · 5.0mm · 0.62mm/px · z∈[-33,+125]mm · 2 of 26 slices shown]
[im 1/26]
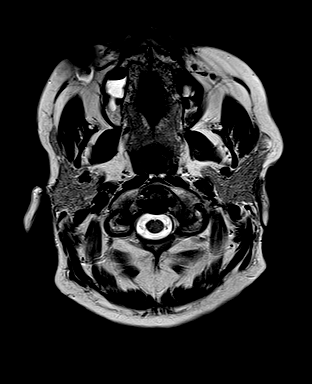
[im 26/26]
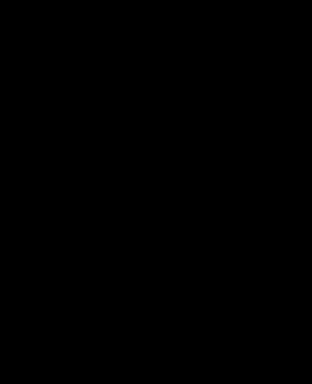

[Series 9: swi_images · axial · 3.0mm · 0.75mm/px · z∈[-29,+120]mm · 3 of 52 slices shown]
[im 1/52]
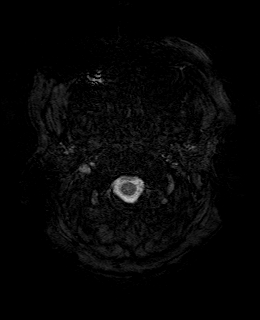
[im 26/52]
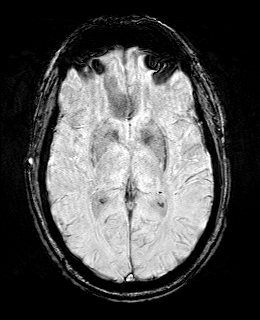
[im 52/52]
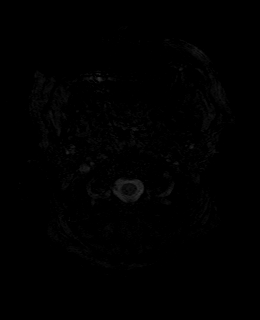

[Series 11: FLAIR · axial · 3.0mm · 0.75mm/px · z∈[-29,+120]mm · 3 of 52 slices shown]
[im 1/52]
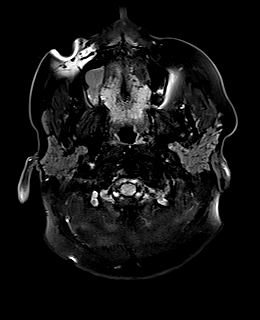
[im 26/52]
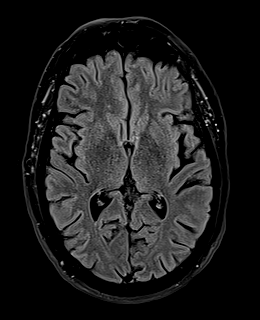
[im 52/52]
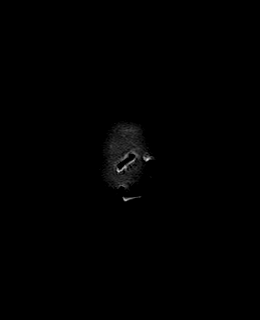

[Series 12: T1 · axial · 1.0mm · 0.94mm/px · z∈[-25,+114]mm · 8 of 144 slices shown (2 of 4)]
[im 1/144]
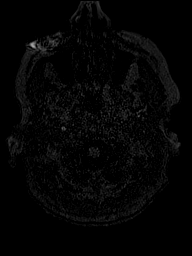
[im 21/144]
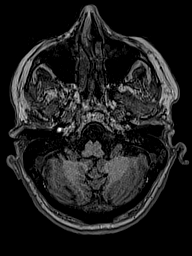
[im 41/144]
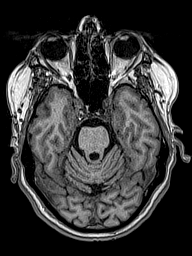
[im 62/144]
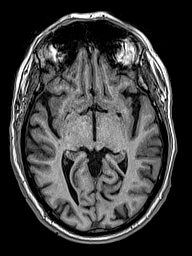
[im 82/144]
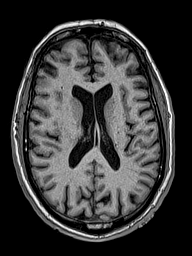
[im 103/144]
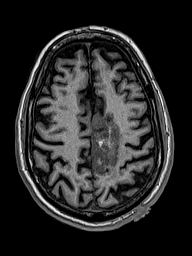
[im 123/144]
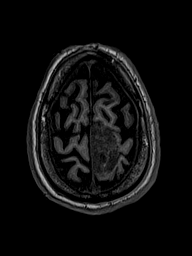
[im 144/144]
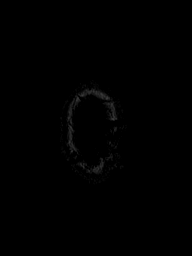

[Series 13: cor dwi_tracew · coronal · 5.0mm · 1.53mm/px · 3 of 52 slices shown]
[im 1/52]
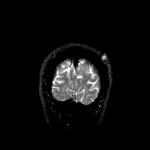
[im 26/52]
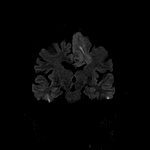
[im 52/52]
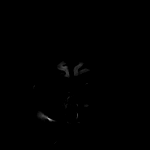

[Series 14: cor dwi_adc · coronal · 5.0mm · 1.53mm/px · 2 of 26 slices shown]
[im 1/26]
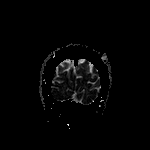
[im 26/26]
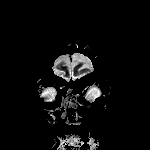

[Series 15: T2 post-contrast · coronal · 5.0mm · 0.57mm/px · 2 of 26 slices shown]
[im 1/26]
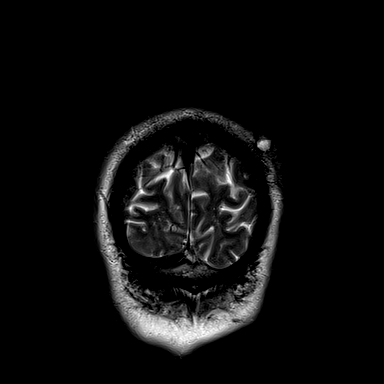
[im 26/26]
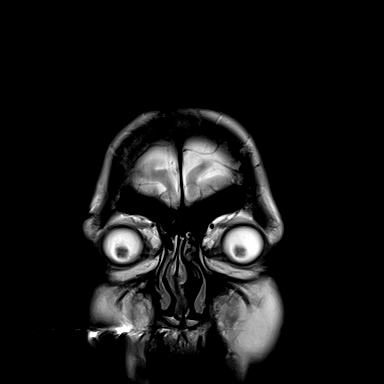

[Series 16: T1 post-contrast · axial · 1.0mm · 0.94mm/px · z∈[-25,+114]mm · 8 of 144 slices shown (1 of 3)]
[im 1/144]
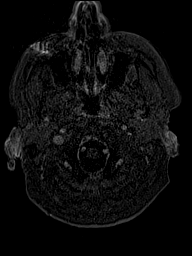
[im 21/144]
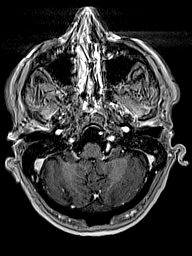
[im 41/144]
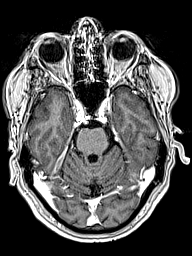
[im 62/144]
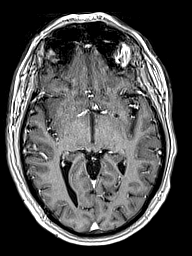
[im 82/144]
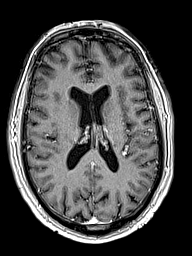
[im 103/144]
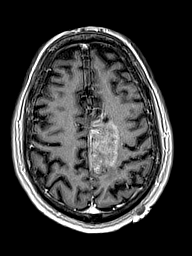
[im 123/144]
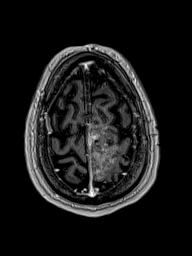
[im 144/144]
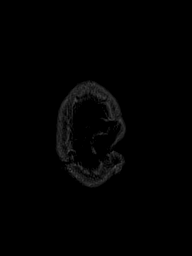

[Series 17: T1 · sagittal · 4.0mm · 0.94mm/px · 2 of 30 slices shown (3 of 4)]
[im 1/30]
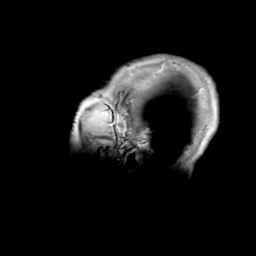
[im 30/30]
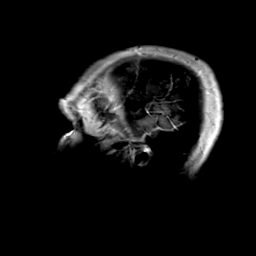

[Series 18: T1 · coronal · 4.0mm · 0.94mm/px · 2 of 42 slices shown (4 of 4)]
[im 1/42]
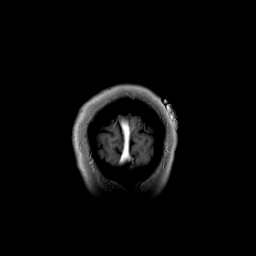
[im 42/42]
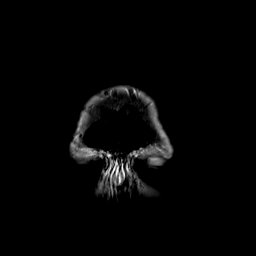

[Series 19: T1 post-contrast · coronal · 5.0mm · 0.43mm/px · 2 of 26 slices shown (2 of 3)]
[im 1/26]
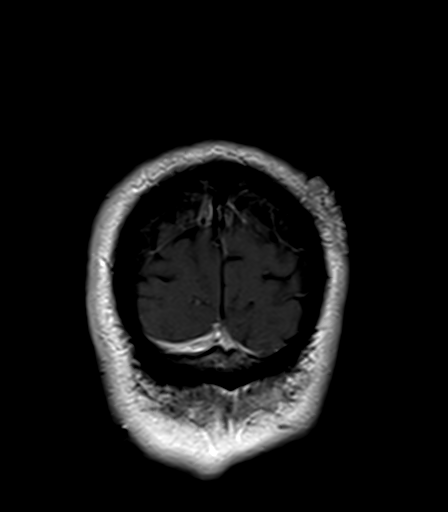
[im 26/26]
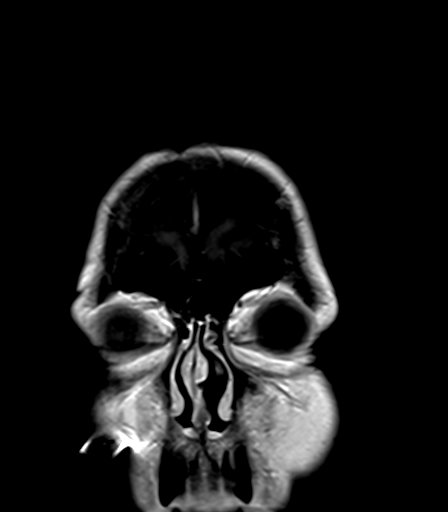

[Series 20: T1 post-contrast · sagittal · 5.0mm · 0.75mm/px · 1 of 24 slices shown (3 of 3)]
[im 1/24]
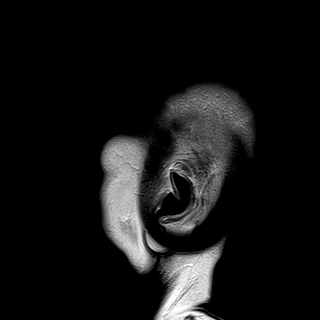

[48 of 48 positions shown; findings below may reference images not displayed]

FINDINGS: Brain: Further increase in size of the mass within the parasagittal
left parietal lobe. This presently measures about 6.1 x 2.9 x 5.3 cm
(previously up to 5 cm). There is increased involvement of the body
of the corpus callosum on the left. There is minimal surrounding T2
FLAIR hyperintensity, but this has increased. There is some focal
mass effect on the adjacent superior sagittal sinus without apparent
invasion.

No acute infarction. There are chronic blood products associated
with the parietal mass. No hydrocephalus.

Vascular: Major vessel flow voids at the skull base are preserved.

Skull and upper cervical spine: Normal marrow signal is preserved.
Left parietal vertex craniotomy.

Sinuses/Orbits: Paranasal sinuses are aerated. Orbits are
unremarkable.

Other: Sella is unremarkable.  Mastoid air cells are clear.
IMPRESSION: Further progression in size of medial left parietal mass including
increased involvement of the corpus callosum. Increased minimal
surrounding T2 FLAIR hyperintensity.

## 2021-12-07 MED ORDER — GADOBUTROL 1 MMOL/ML IV SOLN
8.0000 mL | Freq: Once | INTRAVENOUS | Status: AC | PRN
  Administered 2021-12-07: 8 mL via INTRAVENOUS

## 2021-12-07 MED ORDER — SODIUM CHLORIDE 0.9 % IV SOLN
10.0000 mg/kg | Freq: Once | INTRAVENOUS | Status: AC
  Administered 2021-12-07: 900 mg via INTRAVENOUS
  Filled 2021-12-07: qty 32

## 2021-12-07 MED ORDER — SODIUM CHLORIDE 0.9 % IV SOLN: Freq: Once | INTRAVENOUS | Status: AC

## 2021-12-07 NOTE — Patient Instructions (Signed)
Bevacizumab injection ?What is this medication? ?BEVACIZUMAB (be va SIZ yoo mab) is a monoclonal antibody. It is used to treat many types of cancer. ?This medicine may be used for other purposes; ask your health care provider or pharmacist if you have questions. ?COMMON BRAND NAME(S): Alymsys, Avastin, MVASI, Zirabev ?What should I tell my care team before I take this medication? ?They need to know if you have any of these conditions: ?diabetes ?heart disease ?high blood pressure ?history of coughing up blood ?prior anthracycline chemotherapy (e.g., doxorubicin, daunorubicin, epirubicin) ?recent or ongoing radiation therapy ?recent or planning to have surgery ?stroke ?an unusual or allergic reaction to bevacizumab, hamster proteins, mouse proteins, other medicines, foods, dyes, or preservatives ?pregnant or trying to get pregnant ?breast-feeding ?How should I use this medication? ?This medicine is for infusion into a vein. It is given by a health care professional in a hospital or clinic setting. ?Talk to your pediatrician regarding the use of this medicine in children. Special care may be needed. ?Overdosage: If you think you have taken too much of this medicine contact a poison control center or emergency room at once. ?NOTE: This medicine is only for you. Do not share this medicine with others. ?What if I miss a dose? ?It is important not to miss your dose. Call your doctor or health care professional if you are unable to keep an appointment. ?What may interact with this medication? ?Interactions are not expected. ?This list may not describe all possible interactions. Give your health care provider a list of all the medicines, herbs, non-prescription drugs, or dietary supplements you use. Also tell them if you smoke, drink alcohol, or use illegal drugs. Some items may interact with your medicine. ?What should I watch for while using this medication? ?Your condition will be monitored carefully while you are  receiving this medicine. You will need important blood work and urine testing done while you are taking this medicine. ?This medicine may increase your risk to bruise or bleed. Call your doctor or health care professional if you notice any unusual bleeding. ?Before having surgery, talk to your health care provider to make sure it is ok. This drug can increase the risk of poor healing of your surgical site or wound. You will need to stop this drug for 28 days before surgery. After surgery, wait at least 28 days before restarting this drug. Make sure the surgical site or wound is healed enough before restarting this drug. Talk to your health care provider if questions. ?Do not become pregnant while taking this medicine or for 6 months after stopping it. Women should inform their doctor if they wish to become pregnant or think they might be pregnant. There is a potential for serious side effects to an unborn child. Talk to your health care professional or pharmacist for more information. Do not breast-feed an infant while taking this medicine and for 6 months after the last dose. ?This medicine has caused ovarian failure in some women. This medicine may interfere with the ability to have a child. You should talk to your doctor or health care professional if you are concerned about your fertility. ?What side effects may I notice from receiving this medication? ?Side effects that you should report to your doctor or health care professional as soon as possible: ?allergic reactions like skin rash, itching or hives, swelling of the face, lips, or tongue ?chest pain or chest tightness ?chills ?coughing up blood ?high fever ?seizures ?severe constipation ?signs and symptoms of bleeding  such as bloody or black, tarry stools; red or dark-brown urine; spitting up blood or brown material that looks like coffee grounds; red spots on the skin; unusual bruising or bleeding from the eye, gums, or nose ?signs and symptoms of a blood  clot such as breathing problems; chest pain; severe, sudden headache; pain, swelling, warmth in the leg ?signs and symptoms of a stroke like changes in vision; confusion; trouble speaking or understanding; severe headaches; sudden numbness or weakness of the face, arm or leg; trouble walking; dizziness; loss of balance or coordination ?stomach pain ?sweating ?swelling of legs or ankles ?vomiting ?weight gain ?Side effects that usually do not require medical attention (report to your doctor or health care professional if they continue or are bothersome): ?back pain ?changes in taste ?decreased appetite ?dry skin ?nausea ?tiredness ?This list may not describe all possible side effects. Call your doctor for medical advice about side effects. You may report side effects to FDA at 1-800-FDA-1088. ?Where should I keep my medication? ?This drug is given in a hospital or clinic and will not be stored at home. ?NOTE: This sheet is a summary. It may not cover all possible information. If you have questions about this medicine, talk to your doctor, pharmacist, or health care provider. ?? 2023 Elsevier/Gold Standard (2021-06-22 00:00:00) ? ?

## 2021-12-10 ENCOUNTER — Other Ambulatory Visit (HOSPITAL_COMMUNITY): Payer: Self-pay

## 2021-12-10 ENCOUNTER — Inpatient Hospital Stay: Payer: Medicaid Other

## 2021-12-10 ENCOUNTER — Other Ambulatory Visit: Payer: Self-pay | Admitting: Oncology

## 2021-12-10 ENCOUNTER — Other Ambulatory Visit: Payer: Self-pay

## 2021-12-10 ENCOUNTER — Inpatient Hospital Stay (HOSPITAL_BASED_OUTPATIENT_CLINIC_OR_DEPARTMENT_OTHER): Payer: Medicaid Other | Admitting: Internal Medicine

## 2021-12-10 VITALS — BP 122/85 | HR 89 | Temp 98.8°F | Resp 17 | Ht 76.0 in

## 2021-12-10 DIAGNOSIS — R569 Unspecified convulsions: Secondary | ICD-10-CM | POA: Diagnosis not present

## 2021-12-10 DIAGNOSIS — C711 Malignant neoplasm of frontal lobe: Secondary | ICD-10-CM

## 2021-12-10 DIAGNOSIS — Z5112 Encounter for antineoplastic immunotherapy: Secondary | ICD-10-CM | POA: Diagnosis not present

## 2021-12-10 MED ORDER — LEVETIRACETAM 750 MG PO TABS: 1500.0000 mg | ORAL_TABLET | Freq: Two times a day (BID) | ORAL | 2 refills | Status: AC

## 2021-12-10 MED ORDER — LOMUSTINE 100 MG PO CAPS
100.0000 mg/m2 | ORAL_CAPSULE | Freq: Once | ORAL | 0 refills | Status: DC
  Filled 2021-12-10: qty 2, 1d supply, fill #0

## 2021-12-10 MED ORDER — DIVALPROEX SODIUM 500 MG PO DR TAB: 500.0000 mg | DELAYED_RELEASE_TABLET | Freq: Two times a day (BID) | ORAL | 3 refills | Status: DC

## 2021-12-10 NOTE — Progress Notes (Signed)
? ?[facility] [address]  ?[place], [place] [address] ?[phone] ? ? ?Interval Evaluation ? ?Date of Service: 12/10/21 ?Patient Name: Leonard [name] ?Patient MRN: [id number] ?Patient DOB: 1965-11-17 ?Provider: [name], MD ? ?Identifying Statement:  ?Leonard [name] is a 56 y.o. male with left frontal glioblastoma  ? ?Oncologic History: ?06/15/20: Undergoes craniotomy, resection at [facility]; path demonstrates glioblastoma ?07/25/20: Completes 3 weeks hypofractionated radiation with Temozolomide ([facility]) ?09/29/20: Initiates therapy with 5-day Temozolomide ?04/16/21: Progression of disease, referral placed for LITT vs re-resection at [facility] ?07/19/21: After lost to follow up, further clinical and radiographic progression, declined surgery ?09/21/21: Progression #3 after completing 4th cycle of avastin monotherapy ? ?Biomarkers: ? ?MGMT Unknown.  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  ?EGFR Unknown  ?TERT Unknown  ? ?Interval History: ? Leonard [name] presents today for follow up after completing 2 cycles of CCNU and avastin with Dr. Hinton Rao in [place].  He has tolerated treatment well overall.  Right sided weakness is persistent, maybe a little worse compared to prior visit.  He does describe more difficulty with word finding and expression.  Ambulates at home slowly with a walker.  Some ADL/care is provided by his younger brother, including dressing, cooking. ? ?He and his brother describe significant decline in strength on the right side (arm and leg) over the past few weeks.  They also describe impaired in expression and comprehension of language.  However, this has clearly improved since decadron was started on 07/19/21.  He is now reliant on family/help for most ADLs.  Continues on Keppra and Depakote as prior, did experience several breakthrough seizures prior to resumption of decadron.  They are having a very difficult time getting him out of the house to appointments,  especially with large travel distance. ? ?H+P (09/22/20) Patient presents today on behalf of Dr. Hinton Rao due to recent progression of tumor.  He describes ongoing right leg weakness, requiring use of a walker; this has not apparently worsened significantly in recent weeks.  He also acknowledges some clumsiness and dysfunction affecting the right arm, although subtle.  Denies fatigue, memory issues.  No recent seizures, he has been compliant with Keppra and Depakote.  Vimpat was discontinued because of expense issues.  Otherwise denies new or progressive neurologic deficits. ? ?Medications: ?Current Outpatient Medications on File Prior to Visit  ?Medication Sig Dispense Refill  ? furosemide (LASIX) 20 MG tablet Take 1 tablet by mouth once daily 5 tablet 0  ? ondansetron (ZOFRAN) 4 MG tablet Take 1 tablet (4 mg total) by mouth every 4 (four) hours as needed for nausea. 90 tablet 3  ? albuterol (VENTOLIN HFA) 108 (90 Base) MCG/ACT inhaler Inhale 2 puffs into the lungs every 6 (six) hours as needed for wheezing or shortness of breath. 8 g 2  ? dexamethasone (DECADRON) 4 MG tablet Take 0.5 tablets (2 mg total) by mouth daily. 60 tablet 1  ? divalproex (DEPAKOTE) 500 MG DR tablet Take 1 tablet (500 mg total) by mouth 2 (two) times daily. 60 tablet 3  ? levETIRAcetam (KEPPRA) 750 MG tablet Take 2 tablets (1,500 mg total) by mouth 2 (two) times daily. 120 tablet 2  ? lomustine (GLEOSTINE) 100 MG capsule Take 100 mg/m2 by mouth once. Take 2 capsules (264m) by mouth once every 6 weeks.Take on an emtpy stomach 1 hour before or 2 hours after meals. Caution:chemotherapy    ? magic mouthwash w/lidocaine SOLN Take 5 mLs by mouth 4 (four)  times daily as needed for mouth pain. 240 mL 3  ? ?No current facility-administered medications on file prior to visit.  ? ? ?Allergies: No Known Allergies ?Past Medical History: No past medical history on file. ?Past Surgical History:  ?Social History:  ?Social History  ? ?Socioeconomic History   ? Marital status: Single  ?  Spouse name: Not on file  ? Number of children: Not on file  ? Years of education: Not on file  ? Highest education level: Not on file  ?Occupational History  ? Not on file  ?Tobacco Use  ? Smoking status: Never  ? Smokeless tobacco: Never  ?Substance and Sexual Activity  ? Alcohol use: Never  ? Drug use: Never  ? Sexual activity: Not Currently  ?Other Topics Concern  ? Not on file  ?Social History Narrative  ? Not on file  ? ?Social Determinants of Health  ? ?Financial Resource Strain: Not on file  ?Food Insecurity: Not on file  ?Transportation Needs: Not on file  ?Physical Activity: Not on file  ?Stress: Not on file  ?Social Connections: Not on file  ?Intimate Partner Violence: Not on file  ? ?Family History:  ?Family History  ?Problem Relation Age of Onset  ? Breast cancer Sister   ? ? ?Review of Systems: ?Constitutional: Doesn't report fevers, chills or abnormal weight loss ?Eyes: Doesn't report blurriness of vision ?Ears, nose, mouth, throat, and face: Doesn't report sore throat ?Respiratory: Doesn't report cough, dyspnea or wheezes ?Cardiovascular: Doesn't report palpitation, chest discomfort  ?Gastrointestinal:  Doesn't report nausea, constipation, diarrhea ?GU: Doesn't report incontinence ?Skin: Doesn't report skin rashes ?Neurological: Per HPI ?Musculoskeletal: Doesn't report joint pain ?Behavioral/Psych: Doesn't report anxiety ? ?Physical Exam: ?Vitals:  ? 12/10/21 1129  ?BP: 122/85  ?Pulse: 89  ?Resp: 17  ?Temp: 98.8 ?F (37.1 ?C)  ?SpO2: 97%  ? ?KPS: 60. ?General: Alert, cooperative, pleasant, in no acute distress ?Head: Normal ?EENT: No conjunctival injection or scleral icterus.  ?Lungs: Resp effort normal ?Cardiac: Regular rate ?Abdomen: Non-distended abdomen ?Skin: No rashes cyanosis or petechiae. ?Extremities: +edema LLE ? ?Neurologic Exam: ?Mental Status: Awake, alert, attentive to examiner. Oriented to self and environment. Mixed dysphasia  ?Cranial Nerves: Visual  acuity is grossly normal. Visual fields are full. Extra-ocular movements intact. No ptosis. Face is symmetric ?Motor: Tone and bulk are normal. Power is 2/5 in right leg, 3/5 in right arm. Reflexes are symmetric, no pathologic reflexes present.  ?Sensory: Intact to light touch ?Gait: Non ambulatory ? ? ?Labs: ?I have reviewed the data as listed ?   ?Component Value Date/Time  ? NA 143 12/05/2021 0000  ? K 4.1 12/05/2021 0000  ? CL 107 12/05/2021 0000  ? CO2 29 (A) 12/05/2021 0000  ? GLUCOSE 85 04/16/2021 0829  ? BUN 10 12/05/2021 0000  ? CREATININE 0.7 12/05/2021 0000  ? CREATININE 1.00 04/16/2021 0829  ? CALCIUM 9.4 12/05/2021 0000  ? PROT 6.8 04/16/2021 0829  ? ALBUMIN 4.1 12/05/2021 0000  ? AST 19 12/05/2021 0000  ? AST 9 (L) 04/16/2021 0829  ? ALT 19 12/05/2021 0000  ? ALT 14 04/16/2021 0829  ? ALKPHOS 52 12/05/2021 0000  ? BILITOT 0.3 04/16/2021 0829  ? GFRNONAA >60 04/16/2021 0829  ? ?Lab Results  ?Component Value Date  ? WBC 5.6 12/05/2021  ? NEUTROABS 3.64 12/05/2021  ? HGB 15.0 12/05/2021  ? HCT 46 12/05/2021  ? MCV 98 (A) 12/05/2021  ? PLT 303 12/05/2021  ? ?Imaging: ? ?[facility] Clinician Interpretation: I  have personally reviewed the CNS images as listed.  My interpretation, in the context of the patient's clinical presentation, is progressive disease ? ?MR Brain W Wo Contrast ? ?Result Date: 12/09/2021 ?CLINICAL DATA:  Brain/CNS neoplasm, monitor glioblastoma; glioblastoma EXAM: MRI HEAD WITHOUT AND WITH CONTRAST TECHNIQUE: Multiplanar, multiecho pulse sequences of the brain and surrounding structures were obtained without and with intravenous contrast. CONTRAST:  51m GADAVIST GADOBUTROL 1 MMOL/ML IV SOLN COMPARISON:  09/21/2021 FINDINGS: Brain: Further increase in size of the mass within the parasagittal left parietal lobe. This presently measures about 6.1 x 2.9 x 5.3 cm (previously up to 5 cm). There is increased involvement of the body of the corpus callosum on the left. There is minimal surrounding T2  FLAIR hyperintensity, but this has increased. There is some focal mass effect on the adjacent superior sagittal sinus without apparent invasion. No acute infarction. There are chronic blood products associated

## 2021-12-10 NOTE — Progress Notes (Signed)
?Patient Care Team: ?Patient, No Pcp Per (Inactive) as PCP - General (General Practice) ?Leonard Kaplan, MD as Consulting Physician (Oncology) ?Leonard Sellers, MD as Consulting Physician (Neurology) ? ?Clinic Day: 12/05/21 ? ?Referring physician: [name], MD ? ?ASSESSMENT & PLAN:  ? ?Assessment & Plan: ?Frontal glioblastoma ([place]) ?Grade 4 left frontal glioblastoma.  We know he had two surgeries in November, the first to attempt biopsy and the second for resection.  He started radiation with concurrent temozolomide on December 2nd, and completed 15 fractions of radiation therapy on December 21st.  He had rapid recurrence with a 2.9 cm lesion in the left posterior frontal lobe. He has been on bevacizumab every 2 weeks and tolerating well. He then had continued progression as noted on MRI of the brain. Lomustine was added to his bevacizumab per Dr. Mickeal [name]. He took his first dose in early March without difficulty. He took his second dose of lomustine in mid April and seems to have tolerated it well with some increasing fatigue. He will have MRI brain in the next few days and follow up with Dr. Mickeal [name]. CBC and CMP are unremarkable today. We will see him back in clinic in 2 weeks after his evaluation with Dr. Mickeal [name]. He will proceed with cycle 8 bevacizumab this week.  ?  ?Edema ?He has had off and on edema, mainly of the right lower extremity and foot. This swelling, at times results in weeping areas noted to his skin. Venous doppler ultrasound studies in the past have been negative. We did place him on an antibiotic and weaned him off of his decadron and there was concern the steroids were delaying healing.  This edema goes up and down.  He also continues with physical therapy as his mobility allows.   ?  ?He will have his repeat MRI and follow-up with Dr. Mickeal [name] next week.  We will wait to see if there is any change in his treatment plan.  We can proceed with his bevacizumab for now.  I will see him  back in 2 weeks with CBC, comprehensive metabolic profile, and urinalysis for protein.The patient understands the plans discussed today and is in agreement with them.  He knows to contact our office if he develops concerns prior to his next appointment. ? ? ? ?Leonard Kaplan, MD  ?[facility] ?[place] ?[place] [address] ?Dept: [phone] ?Dept Fax: [phone]  ? ? ?CHIEF COMPLAINT:  ?CC: A 56 year old male with history of frontal glioblastoma here for 2 week evaluation ? ?Current Treatment:  Bevacizumab with Lomustine ? ?INTERVAL HISTORY:  ?[name] is here today for repeat clinical assessment.  He had lost his appetite but it is doing better now.  He complains of a lot of mucus and I recommended that he try Mucinex.  CBC and comprehensive metabolic profile are unremarkable.  He continues to have symptoms edema but not severe.  He took his second dose of lomustine in mid April.  He had a follow-up scheduled with Dr. Mickeal [name] last week but missed it since he did not get his MRI scan.  This has been rescheduled for next week.  I will await Dr. Renda [name] recommendations.  He has lost a few pounds. He denies fevers or chills. He denies pain.  He is in a wheelchair. ? ?I have reviewed the past medical history, past surgical history, social history and family history with the patient and and his brother,  and they are unchanged from previous note. ? ?ALLERGIES:  has No Known Allergies. ? ?MEDICATIONS:  ?Current Outpatient Medications  ?Medication Sig Dispense Refill  ? albuterol (VENTOLIN HFA) 108 (90 Base) MCG/ACT inhaler Inhale 2 puffs into the lungs every 6 (six) hours as needed for wheezing or shortness of breath. 8 g 2  ? divalproex (DEPAKOTE) 500 MG DR tablet Take 1 tablet (500 mg total) by mouth 2 (two) times daily. 60 tablet 3  ? furosemide (LASIX) 20 MG tablet Take 1 tablet by mouth once daily 5 tablet 0  ? levETIRAcetam (KEPPRA) 750 MG  tablet Take 2 tablets (1,500 mg total) by mouth 2 (two) times daily. 120 tablet 2  ? magic mouthwash w/lidocaine SOLN Take 5 mLs by mouth 4 (four) times daily as needed for mouth pain. 240 mL 3  ? ondansetron (ZOFRAN) 4 MG tablet Take 1 tablet (4 mg total) by mouth every 4 (four) hours as needed for nausea. 90 tablet 3  ? ?No current facility-administered medications for this visit.  ? ? ?HISTORY OF PRESENT ILLNESS:  ? ?Oncology History  ?Frontal glioblastoma ([place])  ?06/15/2020 Initial Diagnosis  ? 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[place]) ? ?  ?06/15/2020 Cancer Staging  ? Staging form: Brain and Spinal Cord, AJCC 8th Edition ?- Pathologic stage from 06/15/2020: WHO Grade IV - Signed by Leonard Sellers, MD on 09/30/2020 ?Histopathologic type: Glioblastoma ?Stage prefix: Initial diagnosis ?Histologic grading system: 4 grade system ?Extent of surgical resection: Complete (gross) resection ?Solitary (s) or multifocal (m) tumors in the primary site: Solitary ?Seizures at presentation: Present ?Duration of symptoms before diagnosis: Short ? ?  ?09/29/2020 - 09/29/2020 Chemotherapy  ? Patient is on Treatment Plan : BRAIN GLIOBLASTOMA Consolidation Temozolomide Days 1-5 q28 Days   ? ?  ?  ?08/03/2021 - 12/07/2021 Chemotherapy  ? Patient is on Treatment Plan : BRAIN GBM Bevacizumab 14d   ? ?  ?  ?12/21/2021 -  Chemotherapy  ? Patient is on Treatment Plan : BRAIN GBM [facility] Irinotecan + Bevacizumab D1,15 q 28d  ? ?  ?  ?  ? ? ?REVIEW OF SYSTEMS:  ? ?Constitutional: Denies fevers, chills or abnormal weight loss ?Eyes: Denies blurriness of vision ?Ears, nose, mouth, throat, and face: Denies mucositis or sore throat ?Respiratory: Denies cough, dyspnea or wheezes ?Cardiovascular: Denies palpitation, chest discomfort or lower extremity swelling ?Gastrointestinal:  Denies nausea, heartburn or change in bowel habits ?Skin: Denies abnormal skin rashes ?Lymphatics: Denies new lymphadenopathy or easy bruising ?Neurological:Denies  numbness, tingling or new weaknesses ?Behavioral/Psych: Mood is stable, no new changes  ?All other systems were reviewed with the patient and are negative. ? ? ?VITALS:  ?Blood pressure 115/84, pulse 90, temperature 97.7 ?F (36.5 ?C), temperature source Oral, resp. rate 18, height '6\' 4"'$  (1.93 m), weight 193 lb 11.2 oz (87.9 kg), SpO2 97 %.  ?Wt Readings from Last 3 Encounters:  ?12/05/21 193 lb 11.2 oz (87.9 kg)  ?11/23/21 200 lb 10 oz (91 kg)  ?11/21/21 193 lb 1.6 oz (87.6 kg)  ?  ?Body mass index is 23.58 kg/m?. ? ?Performance status (ECOG): 1 - Symptomatic but completely ambulatory ? ?PHYSICAL EXAM:  ? ?GENERAL:alert, no distress and comfortable ?SKIN: skin color, texture, turgor are normal, no rashes or significant lesions ?EYES: normal, Conjunctiva are pink and non-injected, sclera clear ?OROPHARYNX:no exudate, no erythema and lips, buccal mucosa, and tongue normal  ?NECK: supple, thyroid normal size, non-tender, without nodularity ?LYMPH:  no palpable lymphadenopathy in the cervical, axillary  or inguinal ?LUNGS: clear to auscultation and percussion with normal breathing effort ?HEART: regular rate & rhythm and no murmurs and no lower extremity edema ?ABDOMEN:abdomen soft, non-tender and normal bowel sounds ?Musculoskeletal:no cyanosis of digits and no clubbing  ?NEURO: alert & oriented x 3 with fluent speech, no focal motor/sensory deficits ? ?LABORATORY DATA:  ?I have reviewed the data as listed ?   ?Component Value Date/Time  ? NA 143 12/05/2021 0000  ? K 4.1 12/05/2021 0000  ? CL 107 12/05/2021 0000  ? CO2 29 (A) 12/05/2021 0000  ? GLUCOSE 85 04/16/2021 0829  ? BUN 10 12/05/2021 0000  ? CREATININE 0.7 12/05/2021 0000  ? CREATININE 1.00 04/16/2021 0829  ? CALCIUM 9.4 12/05/2021 0000  ? PROT 6.8 04/16/2021 0829  ? ALBUMIN 4.1 12/05/2021 0000  ? AST 19 12/05/2021 0000  ? AST 9 (L) 04/16/2021 0829  ? ALT 19 12/05/2021 0000  ? ALT 14 04/16/2021 0829  ? ALKPHOS 52 12/05/2021 0000  ? BILITOT 0.3 04/16/2021 0829  ?  GFRNONAA >60 04/16/2021 0829  ? ? ?No results found for: SPEP, UPEP ? ?Lab Results  ?Component Value Date  ? WBC 5.6 12/05/2021  ? NEUTROABS 3.64 12/05/2021  ? HGB 15.0 12/05/2021  ? HCT 46 12/05/2021  ? M

## 2021-12-11 ENCOUNTER — Encounter: Payer: Self-pay | Admitting: Oncology

## 2021-12-11 ENCOUNTER — Other Ambulatory Visit: Payer: Self-pay | Admitting: Oncology

## 2021-12-11 ENCOUNTER — Telehealth: Payer: Self-pay | Admitting: Internal Medicine

## 2021-12-11 NOTE — Telephone Encounter (Signed)
Per 5/8 los called and spoke to pt about lab and md visit in July  pt confirmed appointment  ?

## 2021-12-11 NOTE — Progress Notes (Signed)
DISCONTINUE ON PATHWAY REGIMEN - Neuro ? ? ?  A cycle is every 28 days: ?    Temozolomide  ?    Temozolomide  ? ?**Always confirm dose/schedule in your pharmacy ordering system** ? ?REASON: Disease Progression ?PRIOR TREATMENT: [id number]: Temozolomide 150/200 mg/m2 PO D1-5 q28 Days x up to 12 Cycles ?TREATMENT RESPONSE: Progressive Disease (PD) ? ?START ON PATHWAY REGIMEN - Neuro ? ? ?  A cycle is every 14 days: ?    Bevacizumab-xxxx  ? ?**Always confirm dose/schedule in your pharmacy ordering system** ? ?Patient Characteristics: ?Glioma, Glioblastoma, IDH-wildtype, Recurrent or Progressive, Nonsurgical Candidate, Systemic Therapy Candidate, BRAF V600E Mutation Negative/Unknown and NTRK Fusion Negative/Unknown ?Disease Classification: Glioma ?Disease Classification: Glioblastoma, IDH-wildtype ?Disease Status: Recurrent or Progressive ?Treatment Classification: Nonsurgical Candidate ?Treatment (Nonsurgical/Adjuvant): Systemic Therapy Candidate ?NTRK Gene Fusion Status: Negative ?BRAF V600E Mutation Status: Negative ?Intent of Therapy: ?Non-Curative / Palliative Intent, Discussed with Patient ?

## 2021-12-12 ENCOUNTER — Encounter: Payer: Self-pay | Admitting: Oncology

## 2021-12-16 ENCOUNTER — Other Ambulatory Visit: Payer: Self-pay | Admitting: Oncology

## 2021-12-17 ENCOUNTER — Other Ambulatory Visit: Payer: Self-pay | Admitting: Pharmacist

## 2021-12-17 DIAGNOSIS — C711 Malignant neoplasm of frontal lobe: Secondary | ICD-10-CM

## 2021-12-19 ENCOUNTER — Other Ambulatory Visit: Payer: Medicaid Other

## 2021-12-19 ENCOUNTER — Other Ambulatory Visit: Payer: Self-pay

## 2021-12-19 ENCOUNTER — Inpatient Hospital Stay: Payer: Medicaid Other

## 2021-12-19 ENCOUNTER — Encounter: Payer: Self-pay | Admitting: Hematology and Oncology

## 2021-12-19 ENCOUNTER — Other Ambulatory Visit: Payer: Self-pay | Admitting: Hematology and Oncology

## 2021-12-19 ENCOUNTER — Ambulatory Visit: Payer: Medicaid Other | Admitting: Hematology and Oncology

## 2021-12-19 ENCOUNTER — Inpatient Hospital Stay: Payer: Medicaid Other | Admitting: Hematology and Oncology

## 2021-12-19 VITALS — BP 120/73 | HR 94 | Temp 97.9°F | Resp 16 | Ht 76.0 in | Wt 197.4 lb

## 2021-12-19 DIAGNOSIS — Z5112 Encounter for antineoplastic immunotherapy: Secondary | ICD-10-CM | POA: Diagnosis not present

## 2021-12-19 DIAGNOSIS — C711 Malignant neoplasm of frontal lobe: Secondary | ICD-10-CM

## 2021-12-19 LAB — HEPATIC FUNCTION PANEL
ALT: 13 U/L (ref 10–40)
AST: 17 (ref 14–40)
Alkaline Phosphatase: 45 (ref 25–125)
Bilirubin, Total: 0.5

## 2021-12-19 LAB — CBC AND DIFFERENTIAL
HCT: 47 (ref 41–53)
Hemoglobin: 15.4 (ref 13.5–17.5)
Neutrophils Absolute: 5.11
Platelets: 185 10*3/uL (ref 150–400)
WBC: 7.4

## 2021-12-19 LAB — BASIC METABOLIC PANEL
BUN: 8 (ref 4–21)
CO2: 32 — AB (ref 13–22)
Chloride: 104 (ref 99–108)
Creatinine: 1 (ref 0.6–1.3)
Glucose: 84
Potassium: 4.2 mEq/L (ref 3.5–5.1)
Sodium: 141 (ref 137–147)

## 2021-12-19 LAB — COMPREHENSIVE METABOLIC PANEL
Albumin: 4 (ref 3.5–5.0)
Calcium: 9.5 (ref 8.7–10.7)

## 2021-12-19 LAB — TOTAL PROTEIN, URINE DIPSTICK: Protein, ur: NEGATIVE mg/dL

## 2021-12-19 LAB — CBC: RBC: 4.86 (ref 3.87–5.11)

## 2021-12-19 MED FILL — Bevacizumab-bvzr IV Soln 400 MG/16ML (For Infusion): INTRAVENOUS | Qty: 36 | Status: AC

## 2021-12-19 NOTE — Progress Notes (Signed)
? ?CHEMO CARE CLINIC CONSULT NOTE ? ?Patient Care Team: ?Patient, No Pcp Per (Inactive) as PCP - General (General Practice) ?[name], MD as Consulting Physician (Oncology) ?[name], MD as Consulting Physician (Neurology)  ? ?Name of the patient: Leonard [name]  ?[id number]  ?1965-09-04  ? ?Date of visit: 12/19/21 ? ?Diagnosis- Frontal gliobastoma ? ?Chief complaint/Reason for visit- Initial Meeting for [facility], preparing for starting chemotherapy ? ? ?Heme/Onc history:  ?Oncology History  ?Frontal glioblastoma ([place])  ?06/15/2020 Initial Diagnosis  ? 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 ?  ?06/15/2020 Cancer Staging  ? Staging form: Brain and Spinal Cord, AJCC 8th Edition ?- Pathologic stage from 06/15/2020: WHO Grade IV - Signed by [name], MD on 09/30/2020 ?Histopathologic type: Glioblastoma ?Stage prefix: Initial diagnosis ?Histologic grading system: 4 grade system ?Extent of surgical resection: Complete (gross) resection ?Solitary (s) or multifocal (m) tumors in the primary site: Solitary ?Seizures at presentation: Present ?Duration of symptoms before diagnosis: Short ? ?  ?09/29/2020 - 09/29/2020 Chemotherapy  ? Patient is on Treatment Plan : BRAIN GLIOBLASTOMA Consolidation Temozolomide Days 1-5 q28 Days   ? ?   ?08/03/2021 - 12/07/2021 Chemotherapy  ? Patient is on Treatment Plan : BRAIN GBM Bevacizumab 14d   ? ?   ?12/20/2021 -  Chemotherapy  ? Patient is on Treatment Plan : BRAIN GBM [facility] Irinotecan + Bevacizumab D1,15 q 28d  ? ?   ? ? ?Interval history-  Patient presents to chemo care clinic today for initial meeting in preparation for starting chemotherapy. I introduced the chemo care clinic and we discussed that the role of the clinic is to assist those who are at an increased risk of emergency room visits and/or complications during the course of chemotherapy treatment. We discussed that the increased risk takes into account factors such as age, performance  status, and co-morbidities. We also discussed that for some, this might include barriers to care such as not having a primary care provider, lack of insurance/transportation, or not being able to afford medications. We discussed that the goal of the program is to help prevent unplanned ER visits and help reduce complications during chemotherapy. We do this by discussing specific risk factors to each individual and identifying ways that we can help improve these risk factors and reduce barriers to care.  ? ?No Known Allergies ? ?No past medical history on file. ? ?No past surgical history on file. ? ?Social History  ? ?Socioeconomic History  ? Marital status: Single  ?  Spouse name: Not on file  ? Number of children: Not on file  ? Years of education: Not on file  ? Highest education level: Not on file  ?Occupational History  ? Not on file  ?Tobacco Use  ? Smoking status: Never  ? Smokeless tobacco: Never  ?Substance and Sexual Activity  ? Alcohol use: Never  ? Drug use: Never  ? Sexual activity: Not Currently  ?Other Topics Concern  ? Not on file  ?Social History Narrative  ? Not on file  ? ?Social Determinants of Health  ? ?Financial Resource Strain: Not on file  ?Food Insecurity: Not on file  ?Transportation Needs: Not on file  ?Physical Activity: Not on file  ?Stress: Not on file  ?Social Connections: Not on file  ?Intimate Partner Violence: Not on file  ? ? ?Family History  ?Problem Relation Age of Onset  ? Breast cancer Sister   ? ? ? ?Current Outpatient Medications:  ?  albuterol (VENTOLIN HFA) 108 (90 Base) MCG/ACT inhaler, Inhale 2 puffs into the lungs every 6 (six) hours as needed for wheezing or shortness of breath., Disp: 8 g, Rfl: 2 ?  divalproex (DEPAKOTE) 500 MG DR tablet, Take 1 tablet (500 mg total) by mouth 2 (two) times daily., Disp: 60 tablet, Rfl: 3 ?  furosemide (LASIX) 20 MG tablet, Take 1 tablet by mouth once daily, Disp: 5 tablet, Rfl: 0 ?  levETIRAcetam (KEPPRA) 750 MG tablet, Take 2 tablets  (1,500 mg total) by mouth 2 (two) times daily., Disp: 120 tablet, Rfl: 2 ?  magic mouthwash w/lidocaine SOLN, Take 5 mLs by mouth 4 (four) times daily as needed for mouth pain., Disp: 240 mL, Rfl: 3 ?  ondansetron (ZOFRAN) 4 MG tablet, Take 1 tablet (4 mg total) by mouth every 4 (four) hours as needed for nausea., Disp: 90 tablet, Rfl: 3 ? ? ?  Latest Ref Rng & Units 12/19/2021  ? 12:00 AM  ?CMP  ?BUN 4 - 21 8       ?Creatinine 0.6 - 1.3 1.0       ?Sodium 137 - 147 141       ?Potassium 3.5 - 5.1 mEq/L 4.2       ?Chloride 99 - 108 104       ?CO2 13 - 22 32       ?Calcium 8.7 - 10.7 9.5       ?Alkaline Phos 25 - 125 45       ?AST 14 - 40 17       ?ALT 10 - 40 U/L 13       ?  ? This result is from an external source.  ? ? ?  Latest Ref Rng & Units 12/19/2021  ? 12:00 AM  ?CBC  ?WBC  7.4       ?Hemoglobin 13.5 - 17.5 15.4       ?Hematocrit 41 - 53 47       ?Platelets 150 - 400 K/uL 185       ?  ? This result is from an external source.  ? ? ?No images are attached to the encounter. ? ?MR Brain W Wo Contrast ? ?Result Date: 12/09/2021 ?CLINICAL DATA:  Brain/CNS neoplasm, monitor glioblastoma; glioblastoma EXAM: MRI HEAD WITHOUT AND WITH CONTRAST TECHNIQUE: Multiplanar, multiecho pulse sequences of the brain and surrounding structures were obtained without and with intravenous contrast. CONTRAST:  88m GADAVIST GADOBUTROL 1 MMOL/ML IV SOLN COMPARISON:  09/21/2021 FINDINGS: Brain: Further increase in size of the mass within the parasagittal left parietal lobe. This presently measures about 6.1 x 2.9 x 5.3 cm (previously up to 5 cm). There is increased involvement of the body of the corpus callosum on the left. There is minimal surrounding T2 FLAIR hyperintensity, but this has increased. There is some focal mass effect on the adjacent superior sagittal sinus without apparent invasion. No acute infarction. There are chronic blood products associated with the parietal mass. No hydrocephalus. Vascular: Major vessel flow voids at  the skull base are preserved. Skull and upper cervical spine: Normal marrow signal is preserved. Left parietal vertex craniotomy. Sinuses/Orbits: Paranasal sinuses are aerated. Orbits are unremarkable. Other: Sella is unremarkable.  Mastoid air cells are clear. IMPRESSION: Further progression in size of medial left parietal mass including increased involvement of the corpus callosum. Increased minimal surrounding T2 FLAIR hyperintensity. Electronically Signed   By: PMacy MisM.D.   On: 12/09/2021 12:53   ? ? ?Assessment  and plan- Patient is a 56 y.o. male who presents to [facility] for initial meeting in preparation for starting chemotherapy for the treatment of glioblastoma.  ? ?Chemo Care Clinic/High Risk for ER/Hospitalization during chemotherapy- We discussed the role of the chemo care clinic and identified patient specific risk factors. I discussed that patient was identified as high risk primarily based on: ? ?Patient has past medical history positive for: ?No past medical history on file. ? ?Patient has past surgical history positive for: ?No past surgical history on file. ? ? ? ?Provided general information including the following: ?1.  Date of education: 12/19/2021 ?2.  Physician name: Dr. Hinton Rao ?3.  Diagnosis: Glioblastoma ?4.  Grade IV ?5.  Palliative ?6.  Chemotherapy plan including drugs and how often: Irinotecan, Bevacizumab ?7.  Start date: 12/20/2021 ?8.  Other referrals: None at this time ?9.  The patient is to call our office with any questions or concerns.  Our office number [phone], if after hours or on the weekend, call the same number and wait for the answering service.  There is always an oncologist on call ?10.  Medications prescribed: None  ?11.  The patient has verbalized understanding of the treatment plan and has no barriers to adherence or understanding. ? ?Obtained signed consent from patient. ? ?Discussed symptoms including ?1.  Low blood counts including red blood  cells, white blood cells and platelets. ?2. Infection including to avoid large crowds, wash hands frequently, and stay away from people who were sick.  If fever develops of 100.4 or higher, call our office.

## 2021-12-19 NOTE — Progress Notes (Signed)
..  Pharmacist Chemotherapy Monitoring - Initial Assessment   ? ?Anticipated start date: 12/20/21 ? ?The following has been reviewed per standard work regarding the patient's treatment regimen: ?The patient's diagnosis, treatment plan and drug doses, and organ/hematologic function ?Lab orders and baseline tests specific to treatment regimen  ?The treatment plan start date, drug sequencing, and pre-medications ?Prior authorization status  ?Patient's documented medication list, including drug-drug interaction screen and prescriptions for anti-emetics and supportive care specific to the treatment regimen ?The drug concentrations, fluid compatibility, administration routes, and timing of the medications to be used ?The patient's access for treatment and lifetime cumulative dose history, if applicable  ?The patient's medication allergies and previous infusion related reactions, if applicable  ? ?Changes made to treatment plan:  ?N/A ? ?Follow up needed:  ?N/A ? ? ?Juanetta Beets, [facility], ?12/19/2021  3:57 PM  ?

## 2021-12-20 ENCOUNTER — Inpatient Hospital Stay: Payer: Medicaid Other

## 2021-12-20 VITALS — BP 133/81 | HR 68 | Temp 98.1°F | Resp 18

## 2021-12-20 DIAGNOSIS — C711 Malignant neoplasm of frontal lobe: Secondary | ICD-10-CM

## 2021-12-20 DIAGNOSIS — Z5112 Encounter for antineoplastic immunotherapy: Secondary | ICD-10-CM | POA: Diagnosis not present

## 2021-12-20 MED ORDER — SODIUM CHLORIDE 0.9 % IV SOLN
10.0000 mg | Freq: Once | INTRAVENOUS | Status: AC
  Administered 2021-12-20: 10 mg via INTRAVENOUS
  Filled 2021-12-20: qty 10

## 2021-12-20 MED ORDER — SODIUM CHLORIDE 0.9% FLUSH: 10.0000 mL | INTRAVENOUS | Status: DC | PRN

## 2021-12-20 MED ORDER — IRINOTECAN HCL CHEMO INJECTION 100 MG/5ML
125.0000 mg/m2 | Freq: Once | INTRAVENOUS | Status: AC
  Administered 2021-12-20: 280 mg via INTRAVENOUS
  Filled 2021-12-20: qty 10

## 2021-12-20 MED ORDER — PALONOSETRON HCL INJECTION 0.25 MG/5ML
0.2500 mg | Freq: Once | INTRAVENOUS | Status: AC
  Administered 2021-12-20: 0.25 mg via INTRAVENOUS
  Filled 2021-12-20: qty 5

## 2021-12-20 MED ORDER — ATROPINE SULFATE 1 MG/ML IV SOLN: 0.5000 mg | Freq: Once | INTRAVENOUS | Status: DC | PRN

## 2021-12-20 MED ORDER — SODIUM CHLORIDE 0.9 % IV SOLN: Freq: Once | INTRAVENOUS | Status: AC

## 2021-12-20 MED ORDER — SODIUM CHLORIDE 0.9 % IV SOLN
10.0000 mg/kg | Freq: Once | INTRAVENOUS | Status: AC
  Administered 2021-12-20: 900 mg via INTRAVENOUS
  Filled 2021-12-20: qty 32

## 2021-12-20 NOTE — Patient Instructions (Signed)
[facility]  Discharge Instructions: Thank you for choosing [facility] to provide your oncology and hematology care.  If you have a lab appointment with the [facility], please go directly to the [facility] and check in at the registration area.   Wear comfortable clothing and clothing appropriate for easy access to any Portacath or PICC line.   We strive to give you quality time with your provider. You may need to reschedule your appointment if you arrive late (15 or more minutes).  Arriving late affects you and other patients whose appointments are after yours.  Also, if you miss three or more appointments without notifying the office, you may be dismissed from the clinic at the provider's discretion.      For prescription refill requests, have your pharmacy contact our office and allow 72 hours for refills to be completed.    Today you received the following chemotherapy and/or immunotherapy agents:zirabev and IrinotecanIrinotecan injection What is this medication? IRINOTECAN (ir in oh TEE kan ) is a chemotherapy drug. It is used to treat colon and rectal cancer. This medicine may be used for other purposes; ask your health care provider or pharmacist if you have questions. COMMON BRAND NAME(S): Camptosar What should I tell my care team before I take this medication? They need to know if you have any of these conditions: dehydration diarrhea infection (especially a virus infection such as chickenpox, cold sores, or herpes) liver disease low blood counts, like low white cell, platelet, or red cell counts low levels of calcium, magnesium, or potassium in the blood recent or ongoing radiation therapy an unusual or allergic reaction to irinotecan, other medicines, foods, dyes, or preservatives pregnant or trying to get pregnant breast-feeding How should I use this medication? This drug is given as an infusion into a vein. It is administered in  a hospital or clinic by a specially trained health care professional. Talk to your pediatrician regarding the use of this medicine in children. Special care may be needed. Overdosage: If you think you have taken too much of this medicine contact a poison control center or emergency room at once. NOTE: This medicine is only for you. Do not share this medicine with others. What if I miss a dose? It is important not to miss your dose. Call your doctor or health care professional if you are unable to keep an appointment. What may interact with this medication? Do not take this medicine with any of the following medications: cobicistat itraconazole This medicine may interact with the following medications: antiviral medicines for HIV or AIDS certain antibiotics like rifampin or rifabutin certain medicines for fungal infections like ketoconazole, posaconazole, and voriconazole certain medicines for seizures like carbamazepine, phenobarbital, phenotoin clarithromycin gemfibrozil nefazodone St. John's Wort This list may not describe all possible interactions. Give your health care provider a list of all the medicines, herbs, non-prescription drugs, or dietary supplements you use. Also tell them if you smoke, drink alcohol, or use illegal drugs. Some items may interact with your medicine. What should I watch for while using this medication? Your condition will be monitored carefully while you are receiving this medicine. You will need important blood work done while you are taking this medicine. This drug may make you feel generally unwell. This is not uncommon, as chemotherapy can affect healthy cells as well as cancer cells. Report any side effects. Continue your course of treatment even though you feel ill unless your doctor tells  you to stop. In some cases, you may be given additional medicines to help with side effects. Follow all directions for their use. You may get drowsy or dizzy. Do not  drive, use machinery, or do anything that needs mental alertness until you know how this medicine affects you. Do not stand or sit up quickly, especially if you are an older patient. This reduces the risk of dizzy or fainting spells. Call your health care professional for advice if you get a fever, chills, or sore throat, or other symptoms of a cold or flu. Do not treat yourself. This medicine decreases your body's ability to fight infections. Try to avoid being around people who are sick. Avoid taking products that contain aspirin, acetaminophen, ibuprofen, naproxen, or ketoprofen unless instructed by your doctor. These medicines may hide a fever. This medicine may increase your risk to bruise or bleed. Call your doctor or health care professional if you notice any unusual bleeding. Be careful brushing and flossing your teeth or using a toothpick because you may get an infection or bleed more easily. If you have any dental work done, tell your dentist you are receiving this medicine. Do not become pregnant while taking this medicine or for 6 months after stopping it. Women should inform their health care professional if they wish to become pregnant or think they might be pregnant. Men should not father a child while taking this medicine and for 3 months after stopping it. There is potential for serious side effects to an unborn child. Talk to your health care professional for more information. Do not breast-feed an infant while taking this medicine or for 7 days after stopping it. This medicine has caused ovarian failure in some women. This medicine may make it more difficult to get pregnant. Talk to your health care professional if you are concerned about your fertility. This medicine has caused decreased sperm counts in some men. This may make it more difficult to father a child. Talk to your health care professional if you are concerned about your fertility. What side effects may I notice from receiving  this medication? Side effects that you should report to your doctor or health care professional as soon as possible: allergic reactions like skin rash, itching or hives, swelling of the face, lips, or tongue chest pain diarrhea flushing, runny nose, sweating during infusion low blood counts - this medicine may decrease the number of white blood cells, red blood cells and platelets. You may be at increased risk for infections and bleeding. nausea, vomiting pain, swelling, warmth in the leg signs of decreased platelets or bleeding - bruising, pinpoint red spots on the skin, black, tarry stools, blood in the urine signs of infection - fever or chills, cough, sore throat, pain or difficulty passing urine signs of decreased red blood cells - unusually weak or tired, fainting spells, lightheadedness Side effects that usually do not require medical attention (report to your doctor or health care professional if they continue or are bothersome): constipation hair loss headache loss of appetite mouth sores stomach pain This list may not describe all possible side effects. Call your doctor for medical advice about side effects. You may report side effects to FDA at 1-800-FDA-1088. Where should I keep my medication? This drug is given in a hospital or clinic and will not be stored at home. NOTE: This sheet is a summary. It may not cover all possible information. If you have questions about this medicine, talk to your doctor, pharmacist, or  health care provider.  2023 Elsevier/Gold Standard (2021-06-22 00:00:00) Bevacizumab injection What is this medication? BEVACIZUMAB (be va SIZ yoo mab) is a monoclonal antibody. It is used to treat many types of cancer. This medicine may be used for other purposes; ask your health care provider or pharmacist if you have questions. COMMON BRAND NAME(S): Alymsys, Avastin, MVASI, Noah Charon What should I tell my care team before I take this medication? They need to  know if you have any of these conditions: diabetes heart disease high blood pressure history of coughing up blood prior anthracycline chemotherapy (e.g., doxorubicin, daunorubicin, epirubicin) recent or ongoing radiation therapy recent or planning to have surgery stroke an unusual or allergic reaction to bevacizumab, hamster proteins, mouse proteins, other medicines, foods, dyes, or preservatives pregnant or trying to get pregnant breast-feeding How should I use this medication? This medicine is for infusion into a vein. It is given by a health care professional in a hospital or clinic setting. Talk to your pediatrician regarding the use of this medicine in children. Special care may be needed. Overdosage: If you think you have taken too much of this medicine contact a poison control center or emergency room at once. NOTE: This medicine is only for you. Do not share this medicine with others. What if I miss a dose? It is important not to miss your dose. Call your doctor or health care professional if you are unable to keep an appointment. What may interact with this medication? Interactions are not expected. This list may not describe all possible interactions. Give your health care provider a list of all the medicines, herbs, non-prescription drugs, or dietary supplements you use. Also tell them if you smoke, drink alcohol, or use illegal drugs. Some items may interact with your medicine. What should I watch for while using this medication? Your condition will be monitored carefully while you are receiving this medicine. You will need important blood work and urine testing done while you are taking this medicine. This medicine may increase your risk to bruise or bleed. Call your doctor or health care professional if you notice any unusual bleeding. Before having surgery, talk to your health care provider to make sure it is ok. This drug can increase the risk of poor healing of your surgical  site or wound. You will need to stop this drug for 28 days before surgery. After surgery, wait at least 28 days before restarting this drug. Make sure the surgical site or wound is healed enough before restarting this drug. Talk to your health care provider if questions. Do not become pregnant while taking this medicine or for 6 months after stopping it. Women should inform their doctor if they wish to become pregnant or think they might be pregnant. There is a potential for serious side effects to an unborn child. Talk to your health care professional or pharmacist for more information. Do not breast-feed an infant while taking this medicine and for 6 months after the last dose. This medicine has caused ovarian failure in some women. This medicine may interfere with the ability to have a child. You should talk to your doctor or health care professional if you are concerned about your fertility. What side effects may I notice from receiving this medication? Side effects that you should report to your doctor or health care professional as soon as possible: allergic reactions like skin rash, itching or hives, swelling of the face, lips, or tongue chest pain or chest tightness chills coughing up blood  high fever seizures severe constipation signs and symptoms of bleeding such as bloody or black, tarry stools; red or dark-brown urine; spitting up blood or brown material that looks like coffee grounds; red spots on the skin; unusual bruising or bleeding from the eye, gums, or nose signs and symptoms of a blood clot such as breathing problems; chest pain; severe, sudden headache; pain, swelling, warmth in the leg signs and symptoms of a stroke like changes in vision; confusion; trouble speaking or understanding; severe headaches; sudden numbness or weakness of the face, arm or leg; trouble walking; dizziness; loss of balance or coordination stomach pain sweating swelling of legs or  ankles vomiting weight gain Side effects that usually do not require medical attention (report to your doctor or health care professional if they continue or are bothersome): back pain changes in taste decreased appetite dry skin nausea tiredness This list may not describe all possible side effects. Call your doctor for medical advice about side effects. You may report side effects to FDA at 1-800-FDA-1088. Where should I keep my medication? This drug is given in a hospital or clinic and will not be stored at home. NOTE: This sheet is a summary. It may not cover all possible information. If you have questions about this medicine, talk to your doctor, pharmacist, or health care provider.  2023 Elsevier/Gold Standard (2021-06-22 00:00:00)       To help prevent nausea and vomiting after your treatment, we encourage you to take your nausea medication as directed.  BELOW ARE SYMPTOMS THAT SHOULD BE REPORTED IMMEDIATELY: *FEVER GREATER THAN 100.4 F (38 C) OR HIGHER *CHILLS OR SWEATING *NAUSEA AND VOMITING THAT IS NOT CONTROLLED WITH YOUR NAUSEA MEDICATION *UNUSUAL SHORTNESS OF BREATH *UNUSUAL BRUISING OR BLEEDING *URINARY PROBLEMS (pain or burning when urinating, or frequent urination) *BOWEL PROBLEMS (unusual diarrhea, constipation, pain near the anus) TENDERNESS IN MOUTH AND THROAT WITH OR WITHOUT PRESENCE OF ULCERS (sore throat, sores in mouth, or a toothache) UNUSUAL RASH, SWELLING OR PAIN  UNUSUAL VAGINAL DISCHARGE OR ITCHING   Items with * indicate a potential emergency and should be followed up as soon as possible or go to the Emergency Department if any problems should occur.  Please show the CHEMOTHERAPY ALERT CARD or IMMUNOTHERAPY ALERT CARD at check-in to the Emergency Department and triage nurse.  Should you have questions after your visit or need to cancel or reschedule your appointment, please contact [facility]  Dept: [phone]  and follow  the prompts.  Office hours are 8:00 a.m. to 4:30 p.m. Monday - Friday. Please note that voicemails left after 4:00 p.m. may not be returned until the following business day.  We are closed weekends and major holidays. You have access to a nurse at all times for urgent questions. Please call the main number to the clinic Dept: [phone] and follow the prompts.  For any non-urgent questions, you may also contact your provider using MyChart. We now offer e-Visits for anyone 29 and older to request care online for non-urgent symptoms. For details visit [email or web address].   Also download the MyChart app! Go to the app store, search "MyChart", open the app, select [facility], and log in with your MyChart username and password.  Due to Covid, a mask is required upon entering the hospital/clinic. If you do not have a mask, one will be given to you upon arrival. For doctor visits, patients may have 1 support person aged 42 or older with them. For treatment visits,  patients cannot have anyone with them due to current Covid guidelines and our immunocompromised population.

## 2021-12-21 ENCOUNTER — Other Ambulatory Visit: Payer: Self-pay

## 2021-12-21 ENCOUNTER — Telehealth: Payer: Self-pay

## 2021-12-21 DIAGNOSIS — T451X5A Adverse effect of antineoplastic and immunosuppressive drugs, initial encounter: Secondary | ICD-10-CM

## 2021-12-21 MED ORDER — PROCHLORPERAZINE MALEATE 10 MG PO TABS: 10.0000 mg | ORAL_TABLET | Freq: Four times a day (QID) | ORAL | 1 refills | Status: DC | PRN

## 2021-12-21 NOTE — Telephone Encounter (Signed)
I spoke with Levada Dy. He states he is doing fine. He denies N/V, diarrhea, headache, fevers, SOB, and flu like s/s. Pt reminded to call us if he develops temp of 100.4 or higher day or night. He verbalized understanding. Pt was given Aloxi yesterday via IV, so he will need prescription for compazine sent in to use PRN until @ least Sunday. I told Caesar after the 3 days, he can alternate the nausea medications if he needs to.

## 2022-01-01 ENCOUNTER — Other Ambulatory Visit: Payer: Medicaid Other

## 2022-01-01 ENCOUNTER — Ambulatory Visit: Payer: Medicaid Other | Admitting: Hematology and Oncology

## 2022-01-02 ENCOUNTER — Inpatient Hospital Stay: Payer: Medicaid Other

## 2022-01-02 ENCOUNTER — Ambulatory Visit: Payer: Medicaid Other | Admitting: Hematology and Oncology

## 2022-01-02 ENCOUNTER — Encounter: Payer: Self-pay | Admitting: Hematology and Oncology

## 2022-01-02 ENCOUNTER — Inpatient Hospital Stay: Payer: Medicaid Other | Admitting: Hematology and Oncology

## 2022-01-02 DIAGNOSIS — C711 Malignant neoplasm of frontal lobe: Secondary | ICD-10-CM | POA: Diagnosis not present

## 2022-01-02 DIAGNOSIS — R609 Edema, unspecified: Secondary | ICD-10-CM | POA: Diagnosis not present

## 2022-01-02 LAB — CBC AND DIFFERENTIAL
HCT: 44 (ref 41–53)
Hemoglobin: 14.6 (ref 13.5–17.5)
Neutrophils Absolute: 2.43
Platelets: 248 10*3/uL (ref 150–400)
WBC: 5.4

## 2022-01-02 LAB — BASIC METABOLIC PANEL
BUN: 6 (ref 4–21)
CO2: 27 — AB (ref 13–22)
Chloride: 100 (ref 99–108)
Creatinine: 0.8 (ref 0.6–1.3)
Glucose: 114
Potassium: 3.5 mEq/L (ref 3.5–5.1)
Sodium: 137 (ref 137–147)

## 2022-01-02 LAB — HEPATIC FUNCTION PANEL
ALT: 14 U/L (ref 10–40)
AST: 18 (ref 14–40)
Alkaline Phosphatase: 58 (ref 25–125)
Bilirubin, Total: 0.5

## 2022-01-02 LAB — MAGNESIUM: Magnesium: 1.9

## 2022-01-02 LAB — COMPREHENSIVE METABOLIC PANEL
Albumin: 4 (ref 3.5–5.0)
Calcium: 9.5 (ref 8.7–10.7)

## 2022-01-02 LAB — CBC: RBC: 4.62 (ref 3.87–5.11)

## 2022-01-02 NOTE — Assessment & Plan Note (Addendum)
He has had off and on edema, mainly of the right lower extremity and foot. This swelling, at times results in weeping areas noted to his skin. Venous doppler ultrasound studies in the past have been negative. We did place him on an antibiotic and weaned him off of his decadron and there was concern the steroids were delaying healing.  This edema goes up and down. Today, it is improved. He is no longer doing physical therapy as he thinks his time ran out. I will send new referral today.

## 2022-01-02 NOTE — Assessment & Plan Note (Addendum)
Grade 4 left frontal glioblastoma. We know he had two surgeries in November, the first to attempt biopsy and the second for resection.He started radiation with concurrent temozolomide on December 2nd, and completed 15 fractions of radiation therapy on December 21st. Hehadrapid recurrence with a 2.9 cm lesion in the left posterior frontal lobe. He has been on bevacizumab every 2 weeks and tolerating well. He then hadcontinued progression as noted on MRI of the brain. Lomustine was added to his bevacizumab per Dr. Mickeal Skinner. He took his first dose in early March without difficulty. He took his second dose of lomustine in mid April and seems to have tolerated it well with some increasing fatigue. Unfortunately, MRI revealed continued progression and he was started on Irinotecan with bevacizumab per Dr. Mickeal Skinner. He seems to have tolerated cycle 1 Day 1 well with minimal side effects. He will proceed with Day 15 tomorrow and return to

## 2022-01-02 NOTE — Progress Notes (Signed)
Patient Care Team: Patient, No Pcp Per (Inactive) as PCP - General (General Practice) [name], MD as Consulting Physician (Oncology) [name], MD as Consulting Physician (Neurology)  Clinic Day:  01/02/2022  Referring physician: [name], MD  ASSESSMENT & PLAN:   Assessment & Plan: Frontal glioblastoma ([place]) Grade 4 left frontal glioblastoma.  We know he had two surgeries in November, the first to attempt biopsy and the second for resection.  He started radiation with concurrent temozolomide on December 2nd, and completed 15 fractions of radiation therapy on December 21st.  He had rapid recurrence with a 2.9 cm lesion in the left posterior frontal lobe. He has been on bevacizumab every 2 weeks and tolerating well. He then had continued progression as noted on MRI of the brain. Lomustine was added to his bevacizumab per Dr. Mickeal Skinner. He took his first dose in early March without difficulty. He took his second dose of lomustine in mid April and seems to have tolerated it well with some increasing fatigue. Unfortunately, MRI revealed continued progression and he was started on Irinotecan with bevacizumab per Dr. Mickeal Skinner. He seems to have tolerated cycle 1 Day 1 well with minimal side effects. He will proceed with Day 15 tomorrow and return to   Edema He has had off and on edema, mainly of the right lower extremity and foot. This swelling, at times results in weeping areas noted to his skin. Venous doppler ultrasound studies in the past have been negative. We did place him on an antibiotic and weaned him off of his decadron and there was concern the steroids were delaying healing.  This edema goes up and down. Today, it is improved. He is no longer doing physical therapy as he thinks his time ran out. I will send new referral today.     The patient understands the plans discussed today and is in agreement with them.  He knows to contact our office if he develops concerns  prior to his next appointment.   [name], NP  [facility] [address] [place] [address] Dept: [phone] Dept Fax: [phone]   Orders Placed This Encounter  Procedures   CBC and differential    This external order was created through the Results Console.   CBC    This external order was created through the Results Console.   Basic metabolic panel    This external order was created through the Results Console.   Comprehensive metabolic panel    This external order was created through the Results Console.   Hepatic function panel    This external order was created through the Results Console.   Magnesium    This order was created through External Result Entry      CHIEF COMPLAINT:  CC: A 56 year old male with history of glioblastoma here for 2 week evaluation  Current Treatment:  Irinotecan/ Bevacizumab  INTERVAL HISTORY:  Alyx is here today for repeat clinical assessment. He denies fevers or chills. He denies pain. His appetite is good. His weight has decreased 6 pounds over last 2 weeks .  I have reviewed the past medical history, past surgical history, social history and family history with the patient and they are unchanged from previous note.  ALLERGIES:  has No Known Allergies.  MEDICATIONS:  Current Outpatient Medications  Medication Sig Dispense Refill   albuterol (VENTOLIN HFA) 108 (90 Base) MCG/ACT inhaler Inhale 2 puffs into  the lungs every 6 (six) hours as needed for wheezing or shortness of breath. 8 g 2   divalproex (DEPAKOTE) 500 MG DR tablet Take 1 tablet (500 mg total) by mouth 2 (two) times daily. 60 tablet 3   furosemide (LASIX) 20 MG tablet Take 1 tablet by mouth once daily 5 tablet 0   levETIRAcetam (KEPPRA) 750 MG tablet Take 2 tablets (1,500 mg total) by mouth 2 (two) times daily. 120 tablet 2   magic mouthwash w/lidocaine SOLN Take 5 mLs by mouth 4 (four) times  daily as needed for mouth pain. 240 mL 3   ondansetron (ZOFRAN) 4 MG tablet Take 1 tablet (4 mg total) by mouth every 4 (four) hours as needed for nausea. 90 tablet 3   prochlorperazine (COMPAZINE) 10 MG tablet Take 1 tablet (10 mg total) by mouth every 6 (six) hours as needed for nausea or vomiting. 30 tablet 1   No current facility-administered medications for this visit.   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[place])    06/15/2020 Cancer Staging   Staging form: Brain and Spinal Cord, AJCC 8th Edition - Pathologic stage from 06/15/2020: WHO Grade IV - Signed by [name], MD on 09/30/2020 Histopathologic type: Glioblastoma Stage prefix: Initial diagnosis Histologic grading system: 4 grade system Extent of surgical resection: Complete (gross) resection Solitary (s) or multifocal (m) tumors in the primary site: Solitary Seizures at presentation: Present Duration of symptoms before diagnosis: Short    09/29/2020 - 09/29/2020 Chemotherapy   Patient is on Treatment Plan : BRAIN GLIOBLASTOMA Consolidation Temozolomide Days 1-5 q28 Days       08/03/2021 - 12/07/2021 Chemotherapy   Patient is on Treatment Plan : BRAIN GBM Bevacizumab 14d       12/20/2021 -  Chemotherapy   Patient is on Treatment Plan : BRAIN GBM [facility] Irinotecan + Bevacizumab D1,15 q 28d          REVIEW OF SYSTEMS:   Constitutional: Denies fevers, chills or abnormal weight loss Eyes: Denies blurriness of vision Ears, nose, mouth, throat, and face: Denies mucositis or sore  throat Respiratory: Denies cough, dyspnea or wheezes Cardiovascular: Denies palpitation, chest discomfort or lower extremity swelling Gastrointestinal:  Denies nausea, heartburn or change in bowel habits Skin: Denies abnormal skin rashes Lymphatics: Denies new lymphadenopathy or easy bruising Neurological:Denies numbness, tingling or new weaknesses Behavioral/Psych: Mood is stable, no new changes  All other systems were reviewed with the patient and are negative.   VITALS:  Blood pressure 122/73, pulse (!) 106, temperature 98.5 F (36.9 C), temperature source Oral, resp. rate 18, height '6\' 4"'$  (1.93 m), weight 191 lb (86.6 kg), SpO2 95 %.  Wt Readings from Last 3 Encounters:  01/02/22 191 lb (86.6 kg)  12/19/21 197 lb 6.4 oz (89.5 kg)  12/05/21 193 lb 11.2 oz (87.9 kg)    Body mass index is 23.25 kg/m.  Performance status (ECOG): 2 - Symptomatic, <50% confined to bed  PHYSICAL EXAM:   GENERAL:alert, no distress and comfortable SKIN: skin color, texture,  turgor are normal, no rashes or significant lesions EYES: normal, Conjunctiva are pink and non-injected, sclera clear OROPHARYNX:no exudate, no erythema and lips, buccal mucosa, and tongue normal  NECK: supple, thyroid normal size, non-tender, without nodularity LYMPH:  no palpable lymphadenopathy in the cervical, axillary or inguinal LUNGS: clear to auscultation and percussion with normal breathing effort HEART: regular rate & rhythm and no murmurs and no lower extremity edema ABDOMEN:abdomen soft, non-tender and normal bowel sounds Musculoskeletal:no cyanosis of digits and no clubbing  NEURO: alert & oriented x 3 with fluent speech, no focal motor/sensory deficits  LABORATORY DATA:  I have reviewed the data as listed    Component Value Date/Time   NA 137 01/02/2022 0000   K 3.5 01/02/2022 0000   CL 100 01/02/2022 0000   CO2 27 (A) 01/02/2022 0000   GLUCOSE 85 04/16/2021 0829   BUN 6 01/02/2022 0000   CREATININE 0.8  01/02/2022 0000   CREATININE 1.00 04/16/2021 0829   CALCIUM 9.5 01/02/2022 0000   PROT 6.8 04/16/2021 0829   ALBUMIN 4.0 01/02/2022 0000   AST 18 01/02/2022 0000   AST 9 (L) 04/16/2021 0829   ALT 14 01/02/2022 0000   ALT 14 04/16/2021 0829   ALKPHOS 58 01/02/2022 0000   BILITOT 0.3 04/16/2021 0829   GFRNONAA >60 04/16/2021 0829    No results found for: SPEP, UPEP  Lab Results  Component Value Date   WBC 5.4 01/02/2022   NEUTROABS 2.43 01/02/2022   HGB 14.6 01/02/2022   HCT 44 01/02/2022   MCV 98 (A) 12/05/2021   PLT 248 01/02/2022      Chemistry      Component Value Date/Time   NA 137 01/02/2022 0000   K 3.5 01/02/2022 0000   CL 100 01/02/2022 0000   CO2 27 (A) 01/02/2022 0000   BUN 6 01/02/2022 0000   CREATININE 0.8 01/02/2022 0000   CREATININE 1.00 04/16/2021 0829   GLU 114 01/02/2022 0000      Component Value Date/Time   CALCIUM 9.5 01/02/2022 0000   ALKPHOS 58 01/02/2022 0000   AST 18 01/02/2022 0000   AST 9 (L) 04/16/2021 0829   ALT 14 01/02/2022 0000   ALT 14 04/16/2021 0829   BILITOT 0.3 04/16/2021 0829       RADIOGRAPHIC STUDIES: I have personally reviewed the radiological images as listed and agreed with the findings in the report. MR Brain W Wo Contrast  Result Date: 12/09/2021 CLINICAL DATA:  Brain/CNS neoplasm, monitor glioblastoma; glioblastoma EXAM: MRI HEAD WITHOUT AND WITH CONTRAST TECHNIQUE: Multiplanar, multiecho pulse sequences of the brain and surrounding structures were obtained without and with intravenous contrast. CONTRAST:  60m GADAVIST GADOBUTROL 1 MMOL/ML IV SOLN COMPARISON:  09/21/2021 FINDINGS: Brain: Further increase in size of the mass within the parasagittal left parietal lobe. This presently measures about 6.1 x 2.9 x 5.3 cm (previously up to 5 cm). There is increased involvement of the body of the corpus callosum on the left. There is minimal surrounding T2 FLAIR hyperintensity, but this has increased. There is some focal mass  effect on the adjacent superior sagittal sinus without apparent invasion. No acute infarction. There are chronic blood products associated with the parietal mass. No hydrocephalus. Vascular: Major vessel flow voids at the skull base are preserved. Skull and upper cervical spine: Normal marrow signal is preserved. Left parietal vertex craniotomy. Sinuses/Orbits: Paranasal sinuses are aerated. Orbits are unremarkable. Other: Sella is unremarkable.  Mastoid air cells are clear. IMPRESSION: Further progression in  size of medial left parietal mass including increased involvement of the corpus callosum. Increased minimal surrounding T2 FLAIR hyperintensity. Electronically Signed   By: Macy Mis M.D.   On: 12/09/2021 12:53

## 2022-01-03 ENCOUNTER — Ambulatory Visit: Payer: Medicaid Other

## 2022-01-03 MED FILL — Dexamethasone Sodium Phosphate Inj 100 MG/10ML: INTRAMUSCULAR | Qty: 1 | Status: AC

## 2022-01-03 MED FILL — Bevacizumab-bvzr IV Soln 400 MG/16ML (For Infusion): INTRAVENOUS | Qty: 36 | Status: AC

## 2022-01-03 MED FILL — Irinotecan HCl Inj 100 MG/5ML (20 MG/ML): INTRAVENOUS | Qty: 14 | Status: AC

## 2022-01-04 ENCOUNTER — Other Ambulatory Visit: Payer: Self-pay

## 2022-01-04 ENCOUNTER — Inpatient Hospital Stay: Payer: Medicaid Other

## 2022-01-04 ENCOUNTER — Inpatient Hospital Stay: Payer: Medicaid Other | Attending: Internal Medicine

## 2022-01-04 ENCOUNTER — Other Ambulatory Visit: Payer: Medicaid Other

## 2022-01-04 VITALS — BP 115/84 | HR 97 | Temp 98.2°F | Resp 18

## 2022-01-04 DIAGNOSIS — R569 Unspecified convulsions: Secondary | ICD-10-CM | POA: Insufficient documentation

## 2022-01-04 DIAGNOSIS — Z9221 Personal history of antineoplastic chemotherapy: Secondary | ICD-10-CM | POA: Insufficient documentation

## 2022-01-04 DIAGNOSIS — Z5111 Encounter for antineoplastic chemotherapy: Secondary | ICD-10-CM | POA: Diagnosis present

## 2022-01-04 DIAGNOSIS — R112 Nausea with vomiting, unspecified: Secondary | ICD-10-CM | POA: Insufficient documentation

## 2022-01-04 DIAGNOSIS — R609 Edema, unspecified: Secondary | ICD-10-CM | POA: Insufficient documentation

## 2022-01-04 DIAGNOSIS — Z79899 Other long term (current) drug therapy: Secondary | ICD-10-CM | POA: Insufficient documentation

## 2022-01-04 DIAGNOSIS — C711 Malignant neoplasm of frontal lobe: Secondary | ICD-10-CM

## 2022-01-04 DIAGNOSIS — Z923 Personal history of irradiation: Secondary | ICD-10-CM | POA: Diagnosis not present

## 2022-01-04 DIAGNOSIS — Z5112 Encounter for antineoplastic immunotherapy: Secondary | ICD-10-CM | POA: Insufficient documentation

## 2022-01-04 LAB — TOTAL PROTEIN, URINE DIPSTICK: Protein, ur: NEGATIVE mg/dL

## 2022-01-04 MED ORDER — SODIUM CHLORIDE 0.9 % IV SOLN
10.0000 mg/kg | Freq: Once | INTRAVENOUS | Status: AC
  Administered 2022-01-04: 900 mg via INTRAVENOUS
  Filled 2022-01-04: qty 32

## 2022-01-04 MED ORDER — SODIUM CHLORIDE 0.9 % IV SOLN: Freq: Once | INTRAVENOUS | Status: AC

## 2022-01-04 MED ORDER — PALONOSETRON HCL INJECTION 0.25 MG/5ML
0.2500 mg | Freq: Once | INTRAVENOUS | Status: AC
  Administered 2022-01-04: 0.25 mg via INTRAVENOUS
  Filled 2022-01-04: qty 5

## 2022-01-04 MED ORDER — IRINOTECAN HCL CHEMO INJECTION 100 MG/5ML
125.0000 mg/m2 | Freq: Once | INTRAVENOUS | Status: AC
  Administered 2022-01-04: 280 mg via INTRAVENOUS
  Filled 2022-01-04: qty 10

## 2022-01-04 MED ORDER — ATROPINE SULFATE 1 MG/ML IV SOLN
0.5000 mg | Freq: Once | INTRAVENOUS | Status: AC | PRN
  Administered 2022-01-04: 0.5 mg via INTRAVENOUS
  Filled 2022-01-04: qty 1

## 2022-01-04 MED ORDER — SODIUM CHLORIDE 0.9 % IV SOLN
10.0000 mg | Freq: Once | INTRAVENOUS | Status: AC
  Administered 2022-01-04: 10 mg via INTRAVENOUS
  Filled 2022-01-04: qty 10

## 2022-01-15 NOTE — Progress Notes (Signed)
Patient Care Team: Patient, No Pcp Per as PCP - General (General Practice) [name], MD as Consulting Physician (Oncology) [name], MD as Consulting Physician (Neurology)  Clinic Day:  01/16/22  Referring physician: [name], MD  ASSESSMENT & PLAN:   Assessment & Plan: Assessment & Plan: Frontal glioblastoma ([place]) Grade 4 left frontal glioblastoma.  We know he had two surgeries in November, the first to attempt biopsy and the second for resection.  He started radiation with concurrent temozolomide on December 2nd, and completed 15 fractions of radiation therapy on December 21st.  He had rapid recurrence with a 2.9 cm lesion in the left posterior frontal lobe. He has been on bevacizumab every 2 weeks and tolerating well. He then had continued progression as noted on MRI of the brain. Lomustine was added to his bevacizumab per Dr. Mickeal Skinner. He took his first dose in early March without difficulty. He took his second dose of lomustine in mid April and seems to have tolerated it well with some increasing fatigue. Unfortunately, MRI revealed continued progression and he was started on Irinotecan with bevacizumab per Dr. Mickeal Skinner on May 18. He seems to have tolerated cycle 1 fairly well, but is now experiencing severe nausea and weight loss.    Edema He has had off and on edema, mainly of the right lower extremity and foot. Venous doppler ultrasound studies in the past have been negative.  He said this has resolved since he started irinotecan.   In view of his tremendous amount of weight loss, 15 pounds in the last month, I think we need to do a dose reduction of 20%.  We will go ahead this week and see him back in 2 weeks with CBC, CMP and urinalysis.  He will then return in 4 weeks with the same and Dr. Mickeal Skinner will see him on July 17.  The patient understands the plans discussed today and is in agreement with them.  He knows to contact our office if he develops concerns  prior to his next appointment.   [name], MD  [facility] [address] [place] [address] Dept: [phone] Dept Fax: [phone]   Orders Placed This Encounter  Procedures   Basic metabolic panel    This external order was created through the Results Console.   Comprehensive metabolic panel    This external order was created through the Results Console.   Hepatic function panel    This external order was created through the Results Console.   Magnesium    This order was created through External Result Entry   CBC and differential    This external order was created through the Results Console.   CBC    This external order was created through the Results Console.      CHIEF COMPLAINT:  CC: A 56 year old male with history of glioblastoma here for 2 week evaluation  Current Treatment:  Irinotecan/ Bevacizumab  INTERVAL HISTORY:  [place] is here today for repeat clinical assessment.  His appetite is poor and he is complaining of nausea with the second cycle of treatment and has lost 9 pounds in the last 2 weeks and 15 pounds over the last month.  He says the edema is gone since he started irinotecan and so some of this could be fluid weight, but he is clearly not eating.  CBC and CMP are unremarkable. He denies fevers or chills. He denies  pain.    I have reviewed the past medical history, past surgical history, social history and family history with the patient and they are unchanged from previous note.  ALLERGIES:  has No Known Allergies.  MEDICATIONS:  Current Outpatient Medications  Medication Sig Dispense Refill   albuterol (VENTOLIN HFA) 108 (90 Base) MCG/ACT inhaler Inhale 2 puffs into the lungs every 6 (six) hours as needed for wheezing or shortness of breath. 8 g 2   divalproex (DEPAKOTE) 500 MG DR tablet Take 1 tablet (500 mg total) by mouth 2 (two) times daily. 60 tablet 3   furosemide  (LASIX) 20 MG tablet Take 1 tablet by mouth once daily 5 tablet 0   levETIRAcetam (KEPPRA) 750 MG tablet Take 2 tablets (1,500 mg total) by mouth 2 (two) times daily. 120 tablet 2   magic mouthwash w/lidocaine SOLN Take 5 mLs by mouth 4 (four) times daily as needed for mouth pain. 240 mL 3   ondansetron (ZOFRAN) 4 MG tablet Take 1 tablet (4 mg total) by mouth every 4 (four) hours as needed for nausea. 90 tablet 3   prochlorperazine (COMPAZINE) 10 MG tablet Take 1 tablet (10 mg total) by mouth every 6 (six) hours as needed for nausea or vomiting. 30 tablet 1   No current facility-administered medications for this visit.   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[place])   06/15/2020 Cancer Staging   Staging form: Brain and Spinal Cord, AJCC 8th Edition - Pathologic stage from 06/15/2020: WHO Grade IV - Signed by [name], MD on 09/30/2020 Histopathologic type: Glioblastoma Stage prefix: Initial diagnosis Histologic grading system: 4 grade system Extent of surgical resection: Complete (gross) resection Solitary (s) or multifocal (m) tumors in the primary site: Solitary Seizures at presentation: Present Duration of symptoms before diagnosis: Short   09/29/2020 - 09/29/2020 Chemotherapy   Patient is on Treatment Plan : BRAIN GLIOBLASTOMA Consolidation Temozolomide Days 1-5 q28 Days      08/03/2021 - 12/07/2021 Chemotherapy   Patient is on Treatment Plan : BRAIN GBM Bevacizumab 14d      12/20/2021 -  Chemotherapy   Patient is on Treatment Plan : BRAIN GBM [facility]  Irinotecan + Bevacizumab D1,15 q 28d         REVIEW OF SYSTEMS:   Constitutional: Denies fevers, chills or abnormal weight loss Eyes: Denies blurriness of vision Ears, nose, mouth, throat, and face: Denies mucositis or sore throat Respiratory: Denies cough, dyspnea or wheezes Cardiovascular: Denies palpitation, chest discomfort or lower extremity swelling Gastrointestinal:  Denies nausea, heartburn or change in bowel habits Skin: Denies abnormal skin rashes Lymphatics: Denies new lymphadenopathy or easy bruising Neurological:Denies numbness, tingling or new weaknesses Behavioral/Psych: Mood is stable, no new changes  All other systems were reviewed with the patient and are negative.   VITALS:  Blood pressure 113/76, pulse (!) 111, temperature 97.6 F (36.4 C), temperature source Oral, resp. rate 16, height '6\' 4"'$  (1.93 m), weight 181 lb 12.8 oz (82.5 kg), SpO2 96 %.  Wt Readings from Last  3 Encounters:  01/30/22 176 lb 1.6 oz (79.9 kg)  01/18/22 179 lb 4 oz (81.3 kg)  01/16/22 181 lb 12.8 oz (82.5 kg)    Body mass index is 22.13 kg/m.  Performance status (ECOG): 2 - Symptomatic, <50% confined to bed  PHYSICAL EXAM:   GENERAL:alert, no distress and comfortable SKIN: skin color, texture, turgor are normal, no rashes or significant lesions EYES: normal, Conjunctiva are pink and non-injected, sclera clear OROPHARYNX:no exudate, no erythema and lips, buccal mucosa, and tongue normal  NECK: supple, thyroid normal size, non-tender, without nodularity LYMPH:  no palpable lymphadenopathy in the cervical, axillary or inguinal LUNGS: clear to auscultation and percussion with normal breathing effort HEART: regular rate & rhythm and no murmurs and no lower extremity edema ABDOMEN:abdomen soft, non-tender and normal bowel sounds Musculoskeletal:no cyanosis of digits and no clubbing  NEURO: alert & oriented x 3 with fluent speech, he has a stable right hemiparesis  LABORATORY DATA:  I  have reviewed the data as listed    Component Value Date/Time   NA 132 (A) 01/30/2022 0000   K 3.6 01/30/2022 0000   CL 95 (A) 01/30/2022 0000   CO2 27 (A) 01/30/2022 0000   GLUCOSE 85 04/16/2021 0829   BUN 3 (A) 01/30/2022 0000   CREATININE 0.8 01/30/2022 0000   CREATININE 1.00 04/16/2021 0829   CALCIUM 9.7 01/30/2022 0000   PROT 6.8 04/16/2021 0829   ALBUMIN 4.0 01/30/2022 0000   AST 17 01/30/2022 0000   AST 9 (L) 04/16/2021 0829   ALT 13 01/30/2022 0000   ALT 14 04/16/2021 0829   ALKPHOS 74 01/30/2022 0000   BILITOT 0.3 04/16/2021 0829   GFRNONAA >60 04/16/2021 0829    No results found for: "SPEP", "UPEP"  Lab Results  Component Value Date   WBC 4.3 01/30/2022   NEUTROABS 1.81 01/30/2022   HGB 14.4 01/30/2022   HCT 43 01/30/2022   MCV 98 (A) 12/05/2021   PLT 195 01/30/2022      Chemistry      Component Value Date/Time   NA 132 (A) 01/30/2022 0000   K 3.6 01/30/2022 0000   CL 95 (A) 01/30/2022 0000   CO2 27 (A) 01/30/2022 0000   BUN 3 (A) 01/30/2022 0000   CREATININE 0.8 01/30/2022 0000   CREATININE 1.00 04/16/2021 0829   GLU 115 01/30/2022 0000      Component Value Date/Time   CALCIUM 9.7 01/30/2022 0000   ALKPHOS 74 01/30/2022 0000   AST 17 01/30/2022 0000   AST 9 (L) 04/16/2021 0829   ALT 13 01/30/2022 0000   ALT 14 04/16/2021 0829   BILITOT 0.3 04/16/2021 0829       RADIOGRAPHIC STUDIES: I have personally reviewed the radiological images as listed and agreed with the findings in the report. No results found.

## 2022-01-16 ENCOUNTER — Other Ambulatory Visit: Payer: Self-pay

## 2022-01-16 ENCOUNTER — Inpatient Hospital Stay: Payer: Medicaid Other

## 2022-01-16 ENCOUNTER — Telehealth: Payer: Self-pay | Admitting: Dietician

## 2022-01-16 ENCOUNTER — Encounter: Payer: Self-pay | Admitting: Oncology

## 2022-01-16 ENCOUNTER — Inpatient Hospital Stay (INDEPENDENT_AMBULATORY_CARE_PROVIDER_SITE_OTHER): Payer: Medicaid Other | Admitting: Oncology

## 2022-01-16 VITALS — BP 113/76 | HR 111 | Temp 97.6°F | Resp 16 | Ht 76.0 in | Wt 181.8 lb

## 2022-01-16 DIAGNOSIS — C711 Malignant neoplasm of frontal lobe: Secondary | ICD-10-CM | POA: Diagnosis not present

## 2022-01-16 DIAGNOSIS — R609 Edema, unspecified: Secondary | ICD-10-CM

## 2022-01-16 DIAGNOSIS — Z5112 Encounter for antineoplastic immunotherapy: Secondary | ICD-10-CM | POA: Diagnosis not present

## 2022-01-16 LAB — CBC AND DIFFERENTIAL
HCT: 45 (ref 41–53)
Hemoglobin: 14.7 (ref 13.5–17.5)
Neutrophils Absolute: 1.8
Platelets: 199 10*3/uL (ref 150–400)
WBC: 4.4

## 2022-01-16 LAB — COMPREHENSIVE METABOLIC PANEL
Albumin: 4.2 (ref 3.5–5.0)
Calcium: 9.5 (ref 8.7–10.7)

## 2022-01-16 LAB — HEPATIC FUNCTION PANEL
ALT: 13 U/L (ref 10–40)
AST: 16 (ref 14–40)
Alkaline Phosphatase: 74 (ref 25–125)
Bilirubin, Total: 0.6

## 2022-01-16 LAB — BASIC METABOLIC PANEL
BUN: 4 (ref 4–21)
CO2: 28 — AB (ref 13–22)
Chloride: 97 — AB (ref 99–108)
Creatinine: 0.9 (ref 0.6–1.3)
Glucose: 109
Potassium: 3.8 mEq/L (ref 3.5–5.1)
Sodium: 136 — AB (ref 137–147)

## 2022-01-16 LAB — MAGNESIUM: Magnesium: 2

## 2022-01-16 LAB — CBC: RBC: 4.77 (ref 3.87–5.11)

## 2022-01-16 LAB — TOTAL PROTEIN, URINE DIPSTICK: Protein, ur: NEGATIVE mg/dL

## 2022-01-16 NOTE — Telephone Encounter (Signed)
Patient screened on MST. First attempt to reach since previous nutrition consult.  Provided my cell# on voice mail also sent text to set up a nutrition consult.  April Manson, RDN, LDN Registered Dietitian, [place] Part Time Remote (Usual office hours: Tuesday-Thursday) Cell: [phone]

## 2022-01-16 NOTE — Progress Notes (Signed)
Dose of irinotecan has been reduced by 20% due to nausea and weight loss.

## 2022-01-17 MED FILL — Irinotecan HCl Inj 100 MG/5ML (20 MG/ML): INTRAVENOUS | Qty: 10 | Status: AC

## 2022-01-17 MED FILL — Dexamethasone Sodium Phosphate Inj 100 MG/10ML: INTRAMUSCULAR | Qty: 1 | Status: AC

## 2022-01-18 ENCOUNTER — Inpatient Hospital Stay: Payer: Medicaid Other

## 2022-01-18 VITALS — BP 122/72 | HR 98 | Temp 98.2°F | Resp 18 | Wt 179.2 lb

## 2022-01-18 DIAGNOSIS — C711 Malignant neoplasm of frontal lobe: Secondary | ICD-10-CM

## 2022-01-18 DIAGNOSIS — Z5112 Encounter for antineoplastic immunotherapy: Secondary | ICD-10-CM | POA: Diagnosis not present

## 2022-01-18 MED ORDER — SODIUM CHLORIDE 0.9 % IV SOLN: Freq: Once | INTRAVENOUS | Status: AC

## 2022-01-18 MED ORDER — SODIUM CHLORIDE 0.9 % IV SOLN
10.0000 mg/kg | Freq: Once | INTRAVENOUS | Status: AC
  Administered 2022-01-18: 800 mg via INTRAVENOUS
  Filled 2022-01-18: qty 32

## 2022-01-18 MED ORDER — ATROPINE SULFATE 1 MG/ML IV SOLN
1.0000 mg | Freq: Once | INTRAVENOUS | Status: AC | PRN
  Administered 2022-01-18: 1 mg via INTRAVENOUS
  Filled 2022-01-18: qty 1

## 2022-01-18 MED ORDER — IRINOTECAN HCL CHEMO INJECTION 100 MG/5ML
96.0000 mg/m2 | Freq: Once | INTRAVENOUS | Status: AC
  Administered 2022-01-18: 200 mg via INTRAVENOUS
  Filled 2022-01-18: qty 10

## 2022-01-18 MED ORDER — SODIUM CHLORIDE 0.9 % IV SOLN
10.0000 mg | Freq: Once | INTRAVENOUS | Status: AC
  Administered 2022-01-18: 10 mg via INTRAVENOUS
  Filled 2022-01-18: qty 10

## 2022-01-18 MED ORDER — PALONOSETRON HCL INJECTION 0.25 MG/5ML
0.2500 mg | Freq: Once | INTRAVENOUS | Status: AC
  Administered 2022-01-18: 0.25 mg via INTRAVENOUS
  Filled 2022-01-18: qty 5

## 2022-01-30 ENCOUNTER — Encounter: Payer: Self-pay | Admitting: Hematology and Oncology

## 2022-01-30 ENCOUNTER — Inpatient Hospital Stay: Payer: Medicaid Other | Admitting: Hematology and Oncology

## 2022-01-30 ENCOUNTER — Inpatient Hospital Stay: Payer: Medicaid Other

## 2022-01-30 ENCOUNTER — Telehealth: Payer: Self-pay | Admitting: Dietician

## 2022-01-30 DIAGNOSIS — R112 Nausea with vomiting, unspecified: Secondary | ICD-10-CM | POA: Diagnosis not present

## 2022-01-30 DIAGNOSIS — R609 Edema, unspecified: Secondary | ICD-10-CM

## 2022-01-30 DIAGNOSIS — C711 Malignant neoplasm of frontal lobe: Secondary | ICD-10-CM

## 2022-01-30 DIAGNOSIS — T451X5A Adverse effect of antineoplastic and immunosuppressive drugs, initial encounter: Secondary | ICD-10-CM

## 2022-01-30 LAB — BASIC METABOLIC PANEL
BUN: 3 — AB (ref 4–21)
CO2: 27 — AB (ref 13–22)
Chloride: 95 — AB (ref 99–108)
Creatinine: 0.8 (ref 0.6–1.3)
Glucose: 115
Potassium: 3.6 mEq/L (ref 3.5–5.1)
Sodium: 132 — AB (ref 137–147)

## 2022-01-30 LAB — HEPATIC FUNCTION PANEL
ALT: 13 U/L (ref 10–40)
AST: 17 (ref 14–40)
Alkaline Phosphatase: 74 (ref 25–125)
Bilirubin, Total: 0.6

## 2022-01-30 LAB — CBC: RBC: 4.63 (ref 3.87–5.11)

## 2022-01-30 LAB — COMPREHENSIVE METABOLIC PANEL
Albumin: 4 (ref 3.5–5.0)
Calcium: 9.7 (ref 8.7–10.7)

## 2022-01-30 LAB — CBC AND DIFFERENTIAL
HCT: 43 (ref 41–53)
Hemoglobin: 14.4 (ref 13.5–17.5)
Neutrophils Absolute: 1.81
Platelets: 195 10*3/uL (ref 150–400)
WBC: 4.3

## 2022-01-30 LAB — MAGNESIUM: Magnesium: 1.8

## 2022-01-30 MED ORDER — ONDANSETRON HCL 4 MG PO TABS: 4.0000 mg | ORAL_TABLET | ORAL | 3 refills | Status: AC | PRN

## 2022-01-30 MED ORDER — PROCHLORPERAZINE MALEATE 10 MG PO TABS: 10.0000 mg | ORAL_TABLET | Freq: Four times a day (QID) | ORAL | 1 refills | Status: AC | PRN

## 2022-01-30 NOTE — Telephone Encounter (Signed)
Patient screened on MST. Second attempt to reach since previous nutrition consult.  Provided my cell# on voice mail also sent text to set up a nutrition consult.  April Manson, RDN, LDN Registered Dietitian, [place] Part Time Remote (Usual office hours: Tuesday-Thursday) Cell: [phone]

## 2022-01-30 NOTE — Assessment & Plan Note (Signed)
Grade 4 left frontal glioblastoma. We know he had two surgeries in November, the first to attempt biopsy and the second for resection.He started radiation with concurrent temozolomide on December 2nd, and completed 15 fractions of radiation therapy on December 21st. Hehadrapid recurrence with a 2.9 cm lesion in the left posterior frontal lobe. He has been on bevacizumab every 2 weeks and tolerating well. He then hadcontinued progression as noted on MRI of the brain. Lomustine was added to his bevacizumab per Dr. Mickeal Skinner. He took his first dose in early March without difficulty. He took his second dose of lomustine in mid April and seems to have tolerated it well with some increasing fatigue. Unfortunately, MRI revealed continued progression and he was started on Irinotecan with bevacizumab per Dr. Mickeal Skinner. He seems to have tolerated cycle 1 well with minimal side effects. He will proceed with Day 15, cycle 2 tomorrow and return to clinic in 2 weeks prior to cycle 3.

## 2022-01-30 NOTE — Assessment & Plan Note (Signed)
He has had more nausea and vomiting with this last treatment. He is currently only able to keep down water and applesauce. We discussed taking his nausea medicine 30 minutes prior to eating and also alternating his zofran and compazine throughout the day. I will refill these prescriptions today. I also provided him with Ensure shakes today. He states he does like these, but if he drinks too many he gets diarrhea. We discussed the use of imodium should that occur.

## 2022-01-30 NOTE — Assessment & Plan Note (Signed)
He has had off and on edema, mainly of the right lower extremity and foot. This swelling, at times results in weeping areas noted to his skin. Venous doppler ultrasound studies in the past have been negative. We did place him on an antibiotic and weaned him off of his decadron and there was concern the steroids were delaying healing.  This edema goes up and down. Today, it is improved.

## 2022-01-30 NOTE — Progress Notes (Signed)
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  We know he had two surgeries in November, the first to attempt biopsy and the second for resection.  He started radiation with concurrent temozolomide on December 2nd, and completed 15 fractions of radiation therapy on December 21st.  He had rapid recurrence with a 2.9 cm lesion in the left posterior frontal lobe. He has been on bevacizumab every 2 weeks and tolerating well. He then had continued progression as noted on MRI of the brain. Lomustine was added to his bevacizumab per Dr. Mickeal Skinner. He took his first dose in early March without difficulty. He took his second dose of lomustine in mid April and seems to have tolerated it well with some increasing fatigue. Unfortunately, MRI revealed continued progression and he was started on Irinotecan with bevacizumab per Dr. Mickeal Skinner. He seems to have tolerated cycle 1 well with minimal side effects. He will proceed with Day 15, cycle 2 tomorrow and return to clinic in 2 weeks prior to cycle 3.   Edema He has had off and on edema, mainly of the right lower extremity and foot. This swelling, at times results in weeping areas noted to his skin. Venous doppler ultrasound studies in the past have been negative. We did place him on an antibiotic and weaned him off of his decadron and there was concern the steroids were delaying healing.  This edema goes up and down. Today, it is improved.  Nausea and vomiting He has had more nausea and vomiting with this last treatment. He is currently only able to keep down water and applesauce. We discussed taking his nausea medicine 30 minutes prior to eating  and also alternating his zofran and compazine throughout the day. I will refill these prescriptions today. I also provided him with Ensure shakes today. He states he does like these, but if he drinks too many he gets diarrhea. We discussed the use of imodium should that occur.    The patient understands the plans discussed today and is in agreement with them.  He knows to contact our office if he develops concerns prior to his next appointment.    [name], NP  [facility] [address] [place] [address] Dept: [phone] Dept Fax: [phone]   Orders Placed This Encounter  Procedures   CBC and differential    This external order was created through the Results Console.   CBC    This external order was created through the Results Console.   Basic metabolic panel    This external order was created through the Results Console.   Comprehensive metabolic panel    This external order was created through the Results Console.   Hepatic function panel    This external order was created through the Results Console.   Magnesium    This order was created through External Result Entry      CHIEF COMPLAINT:  CC: A 56 year old male with history of glioblastoma here for 2 week evaluation  Current Treatment:  Irinotecan/ Bevacizumab  INTERVAL HISTORY:  Eason is here today for repeat clinical assessment. He denies fevers or chills. He denies pain. His  appetite is good. His weight has decreased 3 pounds over last 2 weeks .  I have reviewed the past medical history, past surgical history, social history and family history with the patient and they are unchanged from previous note.  ALLERGIES:  has No Known Allergies.  MEDICATIONS:  Current Outpatient Medications  Medication Sig Dispense Refill   albuterol (VENTOLIN HFA) 108 (90 Base) MCG/ACT inhaler Inhale 2 puffs into the lungs every 6 (six) hours as needed  for wheezing or shortness of breath. 8 g 2   divalproex (DEPAKOTE) 500 MG DR tablet Take 1 tablet (500 mg total) by mouth 2 (two) times daily. 60 tablet 3   furosemide (LASIX) 20 MG tablet Take 1 tablet by mouth once daily 5 tablet 0   levETIRAcetam (KEPPRA) 750 MG tablet Take 2 tablets (1,500 mg total) by mouth 2 (two) times daily. 120 tablet 2   magic mouthwash w/lidocaine SOLN Take 5 mLs by mouth 4 (four) times daily as needed for mouth pain. 240 mL 3   ondansetron (ZOFRAN) 4 MG tablet Take 1 tablet (4 mg total) by mouth every 4 (four) hours as needed for nausea. 90 tablet 3   prochlorperazine (COMPAZINE) 10 MG tablet Take 1 tablet (10 mg total) by mouth every 6 (six) hours as needed for nausea or vomiting. 30 tablet 1   No current facility-administered medications for this visit.   Facility-Administered Medications Ordered in Other Visits  Medication Dose Route Frequency Provider Last Rate Last Admin   atropine injection 0.5 mg  0.5 mg Intravenous Once PRN [name], MD       sodium chloride flush (NS) 0.9 % injection 10 mL  10 mL Intracatheter PRN [name], MD        HISTORY OF PRESENT ILLNESS:   Oncology History  Frontal glioblastoma ([place])  06/15/2020 Initial Diagnosis   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[place])   06/15/2020 Cancer Staging   Staging form: Brain and Spinal Cord, AJCC 8th Edition - Pathologic stage from 06/15/2020: WHO Grade IV - Signed by [name], MD on 09/30/2020 Histopathologic type: Glioblastoma Stage prefix: Initial diagnosis Histologic grading system: 4 grade system Extent of surgical resection: Complete (gross) resection Solitary (s) or multifocal (m) tumors in the primary site: Solitary Seizures at presentation: Present Duration of symptoms before diagnosis: Short   09/29/2020 - 09/29/2020 Chemotherapy   Patient is on Treatment Plan : BRAIN GLIOBLASTOMA Consolidation Temozolomide Days 1-5 q28 Days      08/03/2021 -  12/07/2021 Chemotherapy   Patient is on Treatment Plan : BRAIN GBM Bevacizumab 14d      12/20/2021 -  Chemotherapy   Patient is on Treatment Plan : BRAIN GBM [facility] Irinotecan + Bevacizumab D1,15 q 28d         REVIEW OF SYSTEMS:   Constitutional: Denies fevers, chills or abnormal weight loss Eyes: Denies blurriness of vision Ears, nose, mouth, throat, and face: Denies mucositis or sore throat Respiratory: Denies cough, dyspnea or wheezes Cardiovascular: Denies palpitation, chest discomfort or lower extremity swelling Gastrointestinal:  Denies nausea, heartburn or change in bowel habits Skin: Denies abnormal skin rashes Lymphatics: Denies new lymphadenopathy or easy bruising Neurological:Denies numbness, tingling or new weaknesses Behavioral/Psych: Mood is stable, no new changes  All other systems were reviewed with the patient and are negative.   VITALS:  Blood pressure 110/65, pulse 100, temperature (!) 97.5 F (36.4 C), temperature source Oral, resp. rate 18, height '6\' 4"'$  (1.93 m), weight 176 lb 1.6  oz (79.9 kg), SpO2 96 %.  Wt Readings from Last 3 Encounters:  01/30/22 176 lb 1.6 oz (79.9 kg)  01/18/22 179 lb 4 oz (81.3 kg)  01/16/22 181 lb 12.8 oz (82.5 kg)    Body mass index is 21.44 kg/m.  Performance status (ECOG): 1 - Symptomatic but completely ambulatory  PHYSICAL EXAM:   GENERAL:alert, no distress and comfortable SKIN: skin color, texture, turgor are normal, no rashes or significant lesions EYES: normal, Conjunctiva are pink and non-injected, sclera clear OROPHARYNX:no exudate, no erythema and lips, buccal mucosa, and tongue normal  NECK: supple, thyroid normal size, non-tender, without nodularity LYMPH:  no palpable lymphadenopathy in the cervical, axillary or inguinal LUNGS: clear to auscultation and percussion with normal breathing effort HEART: regular rate & rhythm and no murmurs and no lower extremity edema ABDOMEN:abdomen soft, non-tender and normal bowel  sounds Musculoskeletal:no cyanosis of digits and no clubbing  NEURO: alert & oriented x 3 with fluent speech, no focal motor/sensory deficits  LABORATORY DATA:  I have reviewed the data as listed    Component Value Date/Time   NA 132 (A) 01/30/2022 0000   K 3.6 01/30/2022 0000   CL 95 (A) 01/30/2022 0000   CO2 27 (A) 01/30/2022 0000   GLUCOSE 85 04/16/2021 0829   BUN 3 (A) 01/30/2022 0000   CREATININE 0.8 01/30/2022 0000   CREATININE 1.00 04/16/2021 0829   CALCIUM 9.7 01/30/2022 0000   PROT 6.8 04/16/2021 0829   ALBUMIN 4.0 01/30/2022 0000   AST 17 01/30/2022 0000   AST 9 (L) 04/16/2021 0829   ALT 13 01/30/2022 0000   ALT 14 04/16/2021 0829   ALKPHOS 74 01/30/2022 0000   BILITOT 0.3 04/16/2021 0829   GFRNONAA >60 04/16/2021 0829    No results found for: "SPEP", "UPEP"  Lab Results  Component Value Date   WBC 4.3 01/30/2022   NEUTROABS 1.81 01/30/2022   HGB 14.4 01/30/2022   HCT 43 01/30/2022   MCV 98 (A) 12/05/2021   PLT 195 01/30/2022      Chemistry      Component Value Date/Time   NA 132 (A) 01/30/2022 0000   K 3.6 01/30/2022 0000   CL 95 (A) 01/30/2022 0000   CO2 27 (A) 01/30/2022 0000   BUN 3 (A) 01/30/2022 0000   CREATININE 0.8 01/30/2022 0000   CREATININE 1.00 04/16/2021 0829   GLU 115 01/30/2022 0000      Component Value Date/Time   CALCIUM 9.7 01/30/2022 0000   ALKPHOS 74 01/30/2022 0000   AST 17 01/30/2022 0000   AST 9 (L) 04/16/2021 0829   ALT 13 01/30/2022 0000   ALT 14 04/16/2021 0829   BILITOT 0.3 04/16/2021 0829       RADIOGRAPHIC STUDIES: I have personally reviewed the radiological images as listed and agreed with the findings in the report. No results found.

## 2022-01-31 MED FILL — Dexamethasone Sodium Phosphate Inj 100 MG/10ML: INTRAMUSCULAR | Qty: 1 | Status: AC

## 2022-01-31 MED FILL — Bevacizumab-bvzr IV Soln 400 MG/16ML (For Infusion): INTRAVENOUS | Qty: 32 | Status: AC

## 2022-01-31 MED FILL — Irinotecan HCl Inj 100 MG/5ML (20 MG/ML): INTRAVENOUS | Qty: 10 | Status: AC

## 2022-02-01 ENCOUNTER — Inpatient Hospital Stay: Payer: Medicaid Other

## 2022-02-01 VITALS — BP 103/53 | HR 71 | Temp 98.3°F | Resp 16

## 2022-02-01 DIAGNOSIS — C711 Malignant neoplasm of frontal lobe: Secondary | ICD-10-CM

## 2022-02-01 DIAGNOSIS — Z5112 Encounter for antineoplastic immunotherapy: Secondary | ICD-10-CM | POA: Diagnosis not present

## 2022-02-01 MED ORDER — SODIUM CHLORIDE 0.9 % IV SOLN
10.0000 mg/kg | Freq: Once | INTRAVENOUS | Status: AC
  Administered 2022-02-01: 800 mg via INTRAVENOUS
  Filled 2022-02-01: qty 32

## 2022-02-01 MED ORDER — IRINOTECAN HCL CHEMO INJECTION 100 MG/5ML
96.0000 mg/m2 | Freq: Once | INTRAVENOUS | Status: AC
  Administered 2022-02-01: 200 mg via INTRAVENOUS
  Filled 2022-02-01: qty 10

## 2022-02-01 MED ORDER — HEPARIN SOD (PORK) LOCK FLUSH 100 UNIT/ML IV SOLN: 500.0000 [IU] | Freq: Once | INTRAVENOUS | Status: DC | PRN

## 2022-02-01 MED ORDER — SODIUM CHLORIDE 0.9 % IV SOLN
10.0000 mg | Freq: Once | INTRAVENOUS | Status: AC
  Administered 2022-02-01: 10 mg via INTRAVENOUS
  Filled 2022-02-01: qty 10

## 2022-02-01 MED ORDER — SODIUM CHLORIDE 0.9 % IV SOLN: Freq: Once | INTRAVENOUS | Status: AC

## 2022-02-01 MED ORDER — SODIUM CHLORIDE 0.9% FLUSH: 10.0000 mL | INTRAVENOUS | Status: DC | PRN

## 2022-02-01 MED ORDER — PALONOSETRON HCL INJECTION 0.25 MG/5ML
0.2500 mg | Freq: Once | INTRAVENOUS | Status: AC
  Administered 2022-02-01: 0.25 mg via INTRAVENOUS
  Filled 2022-02-01: qty 5

## 2022-02-01 MED ORDER — ATROPINE SULFATE 1 MG/ML IV SOLN
1.0000 mg | Freq: Once | INTRAVENOUS | Status: AC | PRN
  Administered 2022-02-01: 1 mg via INTRAVENOUS
  Filled 2022-02-01: qty 1

## 2022-02-01 NOTE — Patient Instructions (Signed)
[facility]  Discharge Instructions: Thank you for choosing [facility] to provide your oncology and hematology care.  If you have a lab appointment with the [facility], please go directly to the [facility] and check in at the registration area.   Wear comfortable clothing and clothing appropriate for easy access to any Portacath or PICC line.   We strive to give you quality time with your provider. You may need to reschedule your appointment if you arrive late (15 or more minutes).  Arriving late affects you and other patients whose appointments are after yours.  Also, if you miss three or more appointments without notifying the office, you may be dismissed from the clinic at the provider's discretion.      For prescription refill requests, have your pharmacy contact our office and allow 72 hours for refills to be completed.    Today you received the following chemotherapy and/or immunotherapy agents: Zirabev and IrinotecanIrinotecan injection What is this medication? IRINOTECAN (ir in oh TEE kan ) is a chemotherapy drug. It is used to treat colon and rectal cancer. This medicine may be used for other purposes; ask your health care provider or pharmacist if you have questions. COMMON BRAND NAME(S): Camptosar What should I tell my care team before I take this medication? They need to know if you have any of these conditions: dehydration diarrhea infection (especially a virus infection such as chickenpox, cold sores, or herpes) liver disease low blood counts, like low white cell, platelet, or red cell counts low levels of calcium, magnesium, or potassium in the blood recent or ongoing radiation therapy an unusual or allergic reaction to irinotecan, other medicines, foods, dyes, or preservatives pregnant or trying to get pregnant breast-feeding How should I use this medication? This drug is given as an infusion into a vein. It is administered in  a hospital or clinic by a specially trained health care professional. Talk to your pediatrician regarding the use of this medicine in children. Special care may be needed. Overdosage: If you think you have taken too much of this medicine contact a poison control center or emergency room at once. NOTE: This medicine is only for you. Do not share this medicine with others. What if I miss a dose? It is important not to miss your dose. Call your doctor or health care professional if you are unable to keep an appointment. What may interact with this medication? Do not take this medicine with any of the following medications: cobicistat itraconazole This medicine may interact with the following medications: antiviral medicines for HIV or AIDS certain antibiotics like rifampin or rifabutin certain medicines for fungal infections like ketoconazole, posaconazole, and voriconazole certain medicines for seizures like carbamazepine, phenobarbital, phenotoin clarithromycin gemfibrozil nefazodone [facility]. John's Wort This list may not describe all possible interactions. Give your health care provider a list of all the medicines, herbs, non-prescription drugs, or dietary supplements you use. Also tell them if you smoke, drink alcohol, or use illegal drugs. Some items may interact with your medicine. What should I watch for while using this medication? Your condition will be monitored carefully while you are receiving this medicine. You will need important blood work done while you are taking this medicine. This drug may make you feel generally unwell. This is not uncommon, as chemotherapy can affect healthy cells as well as cancer cells. Report any side effects. Continue your course of treatment even though you feel ill unless your doctor  tells you to stop. In some cases, you may be given additional medicines to help with side effects. Follow all directions for their use. You may get drowsy or dizzy. Do not  drive, use machinery, or do anything that needs mental alertness until you know how this medicine affects you. Do not stand or sit up quickly, especially if you are an older patient. This reduces the risk of dizzy or fainting spells. Call your health care professional for advice if you get a fever, chills, or sore throat, or other symptoms of a cold or flu. Do not treat yourself. This medicine decreases your body's ability to fight infections. Try to avoid being around people who are sick. Avoid taking products that contain aspirin, acetaminophen, ibuprofen, naproxen, or ketoprofen unless instructed by your doctor. These medicines may hide a fever. This medicine may increase your risk to bruise or bleed. Call your doctor or health care professional if you notice any unusual bleeding. Be careful brushing and flossing your teeth or using a toothpick because you may get an infection or bleed more easily. If you have any dental work done, tell your dentist you are receiving this medicine. Do not become pregnant while taking this medicine or for 6 months after stopping it. Women should inform their health care professional if they wish to become pregnant or think they might be pregnant. Men should not father a child while taking this medicine and for 3 months after stopping it. There is potential for serious side effects to an unborn child. Talk to your health care professional for more information. Do not breast-feed an infant while taking this medicine or for 7 days after stopping it. This medicine has caused ovarian failure in some women. This medicine may make it more difficult to get pregnant. Talk to your health care professional if you are concerned about your fertility. This medicine has caused decreased sperm counts in some men. This may make it more difficult to father a child. Talk to your health care professional if you are concerned about your fertility. What side effects may I notice from receiving  this medication? Side effects that you should report to your doctor or health care professional as soon as possible: allergic reactions like skin rash, itching or hives, swelling of the face, lips, or tongue chest pain diarrhea flushing, runny nose, sweating during infusion low blood counts - this medicine may decrease the number of white blood cells, red blood cells and platelets. You may be at increased risk for infections and bleeding. nausea, vomiting pain, swelling, warmth in the leg signs of decreased platelets or bleeding - bruising, pinpoint red spots on the skin, black, tarry stools, blood in the urine signs of infection - fever or chills, cough, sore throat, pain or difficulty passing urine signs of decreased red blood cells - unusually weak or tired, fainting spells, lightheadedness Side effects that usually do not require medical attention (report to your doctor or health care professional if they continue or are bothersome): constipation hair loss headache loss of appetite mouth sores stomach pain This list may not describe all possible side effects. Call your doctor for medical advice about side effects. You may report side effects to FDA at 1-800-FDA-1088. Where should I keep my medication? This drug is given in a hospital or clinic and will not be stored at home. NOTE: This sheet is a summary. It may not cover all possible information. If you have questions about this medicine, talk to your doctor, pharmacist,  or health care provider.  2023 Elsevier/Gold Standard (2021-06-22 00:00:00) Bevacizumab injection What is this medication? BEVACIZUMAB (be va SIZ yoo mab) is a monoclonal antibody. It is used to treat many types of cancer. This medicine may be used for other purposes; ask your health care provider or pharmacist if you have questions. COMMON BRAND NAME(S): Alymsys, Avastin, MVASI, Noah Charon What should I tell my care team before I take this medication? They need to  know if you have any of these conditions: diabetes heart disease high blood pressure history of coughing up blood prior anthracycline chemotherapy (e.g., doxorubicin, daunorubicin, epirubicin) recent or ongoing radiation therapy recent or planning to have surgery stroke an unusual or allergic reaction to bevacizumab, hamster proteins, mouse proteins, other medicines, foods, dyes, or preservatives pregnant or trying to get pregnant breast-feeding How should I use this medication? This medicine is for infusion into a vein. It is given by a health care professional in a hospital or clinic setting. Talk to your pediatrician regarding the use of this medicine in children. Special care may be needed. Overdosage: If you think you have taken too much of this medicine contact a poison control center or emergency room at once. NOTE: This medicine is only for you. Do not share this medicine with others. What if I miss a dose? It is important not to miss your dose. Call your doctor or health care professional if you are unable to keep an appointment. What may interact with this medication? Interactions are not expected. This list may not describe all possible interactions. Give your health care provider a list of all the medicines, herbs, non-prescription drugs, or dietary supplements you use. Also tell them if you smoke, drink alcohol, or use illegal drugs. Some items may interact with your medicine. What should I watch for while using this medication? Your condition will be monitored carefully while you are receiving this medicine. You will need important blood work and urine testing done while you are taking this medicine. This medicine may increase your risk to bruise or bleed. Call your doctor or health care professional if you notice any unusual bleeding. Before having surgery, talk to your health care provider to make sure it is ok. This drug can increase the risk of poor healing of your surgical  site or wound. You will need to stop this drug for 28 days before surgery. After surgery, wait at least 28 days before restarting this drug. Make sure the surgical site or wound is healed enough before restarting this drug. Talk to your health care provider if questions. Do not become pregnant while taking this medicine or for 6 months after stopping it. Women should inform their doctor if they wish to become pregnant or think they might be pregnant. There is a potential for serious side effects to an unborn child. Talk to your health care professional or pharmacist for more information. Do not breast-feed an infant while taking this medicine and for 6 months after the last dose. This medicine has caused ovarian failure in some women. This medicine may interfere with the ability to have a child. You should talk to your doctor or health care professional if you are concerned about your fertility. What side effects may I notice from receiving this medication? Side effects that you should report to your doctor or health care professional as soon as possible: allergic reactions like skin rash, itching or hives, swelling of the face, lips, or tongue chest pain or chest tightness chills coughing up  blood high fever seizures severe constipation signs and symptoms of bleeding such as bloody or black, tarry stools; red or dark-brown urine; spitting up blood or brown material that looks like coffee grounds; red spots on the skin; unusual bruising or bleeding from the eye, gums, or nose signs and symptoms of a blood clot such as breathing problems; chest pain; severe, sudden headache; pain, swelling, warmth in the leg signs and symptoms of a stroke like changes in vision; confusion; trouble speaking or understanding; severe headaches; sudden numbness or weakness of the face, arm or leg; trouble walking; dizziness; loss of balance or coordination stomach pain sweating swelling of legs or  ankles vomiting weight gain Side effects that usually do not require medical attention (report to your doctor or health care professional if they continue or are bothersome): back pain changes in taste decreased appetite dry skin nausea tiredness This list may not describe all possible side effects. Call your doctor for medical advice about side effects. You may report side effects to FDA at 1-800-FDA-1088. Where should I keep my medication? This drug is given in a hospital or clinic and will not be stored at home. NOTE: This sheet is a summary. It may not cover all possible information. If you have questions about this medicine, talk to your doctor, pharmacist, or health care provider.  2023 Elsevier/Gold Standard (2021-06-22 00:00:00)       To help prevent nausea and vomiting after your treatment, we encourage you to take your nausea medication as directed.  BELOW ARE SYMPTOMS THAT SHOULD BE REPORTED IMMEDIATELY: *FEVER GREATER THAN 100.4 F (38 C) OR HIGHER *CHILLS OR SWEATING *NAUSEA AND VOMITING THAT IS NOT CONTROLLED WITH YOUR NAUSEA MEDICATION *UNUSUAL SHORTNESS OF BREATH *UNUSUAL BRUISING OR BLEEDING *URINARY PROBLEMS (pain or burning when urinating, or frequent urination) *BOWEL PROBLEMS (unusual diarrhea, constipation, pain near the anus) TENDERNESS IN MOUTH AND THROAT WITH OR WITHOUT PRESENCE OF ULCERS (sore throat, sores in mouth, or a toothache) UNUSUAL RASH, SWELLING OR PAIN  UNUSUAL VAGINAL DISCHARGE OR ITCHING   Items with * indicate a potential emergency and should be followed up as soon as possible or go to the Emergency Department if any problems should occur.  Please show the CHEMOTHERAPY ALERT CARD or IMMUNOTHERAPY ALERT CARD at check-in to the Emergency Department and triage nurse.  Should you have questions after your visit or need to cancel or reschedule your appointment, please contact [facility]  Dept: [phone]  and follow  the prompts.  Office hours are 8:00 a.m. to 4:30 p.m. Monday - Friday. Please note that voicemails left after 4:00 p.m. may not be returned until the following business day.  We are closed weekends and major holidays. You have access to a nurse at all times for urgent questions. Please call the main number to the clinic Dept: [phone] and follow the prompts.  For any non-urgent questions, you may also contact your provider using MyChart. We now offer e-Visits for anyone 76 and older to request care online for non-urgent symptoms. For details visit [email or web address].   Also download the MyChart app! Go to the app store, search "MyChart", open the app, select [facility], and log in with your MyChart username and password.  Masks are optional in the cancer centers. If you would like for your care team to wear a mask while they are taking care of you, please let them know. For doctor visits, patients may have with them one support person who is  at least 57 years old. At this time, visitors are not allowed in the infusion area.

## 2022-02-01 NOTE — Progress Notes (Signed)
1245- Attempted to obtain urine-patient was unable to urinate-

## 2022-02-11 ENCOUNTER — Encounter: Payer: Self-pay | Admitting: Oncology

## 2022-02-13 ENCOUNTER — Other Ambulatory Visit: Payer: Self-pay | Admitting: Oncology

## 2022-02-13 ENCOUNTER — Encounter: Payer: Self-pay | Admitting: Oncology

## 2022-02-13 ENCOUNTER — Inpatient Hospital Stay: Payer: Medicaid Other | Attending: Internal Medicine | Admitting: Oncology

## 2022-02-13 ENCOUNTER — Inpatient Hospital Stay: Payer: Medicaid Other

## 2022-02-13 VITALS — BP 106/65 | HR 108 | Temp 97.4°F | Resp 20 | Ht 76.0 in | Wt 166.0 lb

## 2022-02-13 DIAGNOSIS — C711 Malignant neoplasm of frontal lobe: Secondary | ICD-10-CM | POA: Diagnosis not present

## 2022-02-13 DIAGNOSIS — Z79899 Other long term (current) drug therapy: Secondary | ICD-10-CM | POA: Insufficient documentation

## 2022-02-13 DIAGNOSIS — R634 Abnormal weight loss: Secondary | ICD-10-CM | POA: Insufficient documentation

## 2022-02-13 DIAGNOSIS — E876 Hypokalemia: Secondary | ICD-10-CM | POA: Insufficient documentation

## 2022-02-13 DIAGNOSIS — R531 Weakness: Secondary | ICD-10-CM | POA: Insufficient documentation

## 2022-02-13 DIAGNOSIS — R5383 Other fatigue: Secondary | ICD-10-CM | POA: Insufficient documentation

## 2022-02-13 LAB — BASIC METABOLIC PANEL
BUN: 7 (ref 4–21)
CO2: 27 — AB (ref 13–22)
Chloride: 93 — AB (ref 99–108)
Creatinine: 0.8 (ref 0.6–1.3)
Glucose: 115
Potassium: 3 mEq/L — AB (ref 3.5–5.1)
Sodium: 131 — AB (ref 137–147)

## 2022-02-13 LAB — CBC AND DIFFERENTIAL
HCT: 41 (ref 41–53)
Hemoglobin: 13.8 (ref 13.5–17.5)
Neutrophils Absolute: 1.64
Platelets: 247 10*3/uL (ref 150–400)
WBC: 4.2

## 2022-02-13 LAB — HEPATIC FUNCTION PANEL
ALT: 15 U/L (ref 10–40)
AST: 24 (ref 14–40)
Alkaline Phosphatase: 84 (ref 25–125)
Bilirubin, Total: 0.6

## 2022-02-13 LAB — COMPREHENSIVE METABOLIC PANEL
Albumin: 4 (ref 3.5–5.0)
Calcium: 9.6 (ref 8.7–10.7)

## 2022-02-13 LAB — CBC: RBC: 4.51 (ref 3.87–5.11)

## 2022-02-13 MED ORDER — POTASSIUM CHLORIDE CRYS ER 20 MEQ PO TBCR: 20.0000 meq | EXTENDED_RELEASE_TABLET | Freq: Two times a day (BID) | ORAL | 5 refills | Status: AC

## 2022-02-15 ENCOUNTER — Ambulatory Visit: Payer: Medicaid Other

## 2022-02-15 ENCOUNTER — Other Ambulatory Visit: Payer: Self-pay | Admitting: Radiation Therapy

## 2022-02-17 ENCOUNTER — Ambulatory Visit (HOSPITAL_COMMUNITY)
Admission: RE | Admit: 2022-02-17 | Discharge: 2022-02-17 | Disposition: A | Discharge status: Home / Self Care | Payer: Medicaid Other | Source: Ambulatory Visit | Attending: Internal Medicine | Admitting: Internal Medicine

## 2022-02-17 DIAGNOSIS — C711 Malignant neoplasm of frontal lobe: Secondary | ICD-10-CM | POA: Insufficient documentation

## 2022-02-17 MED ORDER — GADOBUTROL 1 MMOL/ML IV SOLN
7.0000 mL | Freq: Once | INTRAVENOUS | Status: AC | PRN
  Administered 2022-02-17: 7 mL via INTRAVENOUS

## 2022-02-18 ENCOUNTER — Telehealth: Payer: Self-pay

## 2022-02-18 ENCOUNTER — Other Ambulatory Visit: Payer: Self-pay

## 2022-02-18 ENCOUNTER — Inpatient Hospital Stay: Payer: Medicaid Other

## 2022-02-18 ENCOUNTER — Inpatient Hospital Stay: Payer: Medicaid Other | Admitting: Internal Medicine

## 2022-02-18 VITALS — BP 102/74 | HR 108 | Temp 97.7°F | Resp 17 | Wt 165.4 lb

## 2022-02-18 DIAGNOSIS — R531 Weakness: Secondary | ICD-10-CM | POA: Diagnosis not present

## 2022-02-18 DIAGNOSIS — C711 Malignant neoplasm of frontal lobe: Secondary | ICD-10-CM

## 2022-02-18 DIAGNOSIS — Z79899 Other long term (current) drug therapy: Secondary | ICD-10-CM | POA: Diagnosis not present

## 2022-02-18 DIAGNOSIS — R5383 Other fatigue: Secondary | ICD-10-CM | POA: Diagnosis not present

## 2022-02-18 DIAGNOSIS — R569 Unspecified convulsions: Secondary | ICD-10-CM

## 2022-02-18 DIAGNOSIS — R634 Abnormal weight loss: Secondary | ICD-10-CM | POA: Diagnosis not present

## 2022-02-18 NOTE — Progress Notes (Signed)
[facility] [place], [place] [address] [phone]   Interval Evaluation  Date of Service: 02/18/22 Patient Name: Leonard [name] Patient MRN: [id number] Patient DOB: 1966-01-02 Provider: [name], MD  Identifying Statement:  Leonard [name] is a 56 y.o. male with left frontal glioblastoma   Oncologic History: 06/15/20: Undergoes craniotomy, resection at [facility]; path demonstrates glioblastoma 07/25/20: Completes 3 weeks hypofractionated radiation with Temozolomide ([facility]) 09/29/20: Initiates therapy with 5-day Temozolomide 04/16/21: Progression of disease, referral placed for LITT vs re-resection at [facility] 07/19/21: After lost to follow up, further clinical and radiographic progression, declined surgery 09/21/21: Progression #3 after completing 4th cycle of avastin monotherapy  Biomarkers:  MGMT Unknown.  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  EGFR Unknown  TERT Unknown   Interval History:  Leonard [name] presents today for follow up after completing 4 cycles of irinotecan and avastin with Dr. Hinton Rao in [place].  He has declined overall, with increase in fatigue, lethargy, weakness, and >15 lb weight loss.  Right sided weakness is persistent, no meaningful use of the limbs on that side.  Word finding and expression difficulties have increased.  Needs help with most/all ADLs through his brother Leonard [name].  He and his brother describe significant decline in strength on the right side (arm and leg) over the past few weeks.  They also describe impaired in expression and comprehension of language.  However, this has clearly improved since decadron was started on 07/19/21.  He is now reliant on family/help for most ADLs.  Continues on Keppra and Depakote as prior, did experience several breakthrough seizures prior to resumption of decadron.  They are having a very difficult time getting him out of the house to appointments, especially  with large travel distance.  H+P (09/22/20) Patient presents today on behalf of Dr. Hinton Rao due to recent progression of tumor.  He describes ongoing right leg weakness, requiring use of a walker; this has not apparently worsened significantly in recent weeks.  He also acknowledges some clumsiness and dysfunction affecting the right arm, although subtle.  Denies fatigue, memory issues.  No recent seizures, he has been compliant with Keppra and Depakote.  Vimpat was discontinued because of expense issues.  Otherwise denies new or progressive neurologic deficits.  Medications: Current Outpatient Medications on File Prior to Visit  Medication Sig Dispense Refill   albuterol (VENTOLIN HFA) 108 (90 Base) MCG/ACT inhaler Inhale 2 puffs into the lungs every 6 (six) hours as needed for wheezing or shortness of breath. 8 g 2   divalproex (DEPAKOTE) 500 MG DR tablet Take 1 tablet (500 mg total) by mouth 2 (two) times daily. 60 tablet 3   furosemide (LASIX) 20 MG tablet Take 1 tablet by mouth once daily 5 tablet 0   levETIRAcetam (KEPPRA) 750 MG tablet Take 2 tablets (1,500 mg total) by mouth 2 (two) times daily. 120 tablet 2   magic mouthwash w/lidocaine SOLN Take 5 mLs by mouth 4 (four) times daily as needed for mouth pain. 240 mL 3   ondansetron (ZOFRAN) 4 MG tablet Take 1 tablet (4 mg total) by mouth every 4 (four) hours as needed for nausea. 90 tablet 3   potassium chloride SA (KLOR-CON M) 20 MEQ tablet Take 1 tablet (20 mEq total) by mouth 2 (two) times daily. 60 tablet 5   prochlorperazine (COMPAZINE) 10 MG tablet Take 1 tablet (10 mg total) by mouth every 6 (six) hours as needed for nausea or vomiting.  30 tablet 1   Current Facility-Administered Medications on File Prior to Visit  Medication Dose Route Frequency Provider Last Rate Last Admin   atropine injection 0.5 mg  0.5 mg Intravenous Once PRN Leonard Kaplan, MD       sodium chloride flush (NS) 0.9 % injection 10 mL  10 mL Intracatheter PRN  Leonard Kaplan, MD        Allergies: No Known Allergies Past Medical History: No past medical history on file. Past Surgical History:  Social History:  Social History   Socioeconomic History   Marital status: Single    Spouse name: Not on file   Number of children: Not on file   Years of education: Not on file   Highest education level: Not on file  Occupational History   Not on file  Tobacco Use   Smoking status: Never   Smokeless tobacco: Never  Substance and Sexual Activity   Alcohol use: Never   Drug use: Never   Sexual activity: Not Currently  Other Topics Concern   Not on file  Social History Narrative   Not on file   Social Determinants of Health   Financial Resource Strain: Not on file  Food Insecurity: Not on file  Transportation Needs: Not on file  Physical Activity: Not on file  Stress: Not on file  Social Connections: Not on file  Intimate Partner Violence: Not on file   Family History:  Family History  Problem Relation Age of Onset   Breast cancer Sister     Review of Systems: Constitutional: Doesn't report fevers, chills or abnormal weight loss Eyes: Doesn't report blurriness of vision Ears, nose, mouth, throat, and face: Doesn't report sore throat Respiratory: Doesn't report cough, dyspnea or wheezes Cardiovascular: Doesn't report palpitation, chest discomfort  Gastrointestinal:  Doesn't report nausea, constipation, diarrhea GU: Doesn't report incontinence Skin: Doesn't report skin rashes Neurological: Per HPI Musculoskeletal: Doesn't report joint pain Behavioral/Psych: Doesn't report anxiety  Physical Exam: Vitals:   02/18/22 1116  BP: 102/74  Pulse: (!) 108  Resp: 17  Temp: 97.7 F (36.5 C)  SpO2: 100%   KPS: 60. General: Thin appearing Head: Normal EENT: No conjunctival injection or scleral icterus.  Lungs: Resp effort normal Cardiac: Regular rate Abdomen: Non-distended abdomen Skin: No rashes cyanosis or  petechiae. Extremities: no edema  Neurologic Exam: Mental Status: Awake, alert, attentive to examiner. Oriented to self and environment. Moderate mixed dysphasia  Cranial Nerves: Visual acuity is grossly normal. Visual fields are full. Extra-ocular movements intact. No ptosis. Face is symmetric Motor: Tone and bulk are normal. Power is 2/5 in right leg, 2/5 in right arm. Reflexes are symmetric, no pathologic reflexes present.  Sensory: Intact to light touch Gait: Non ambulatory   Labs: I have reviewed the data as listed    Component Value Date/Time   NA 131 (A) 02/13/2022 0000   K 3.0 (A) 02/13/2022 0000   CL 93 (A) 02/13/2022 0000   CO2 27 (A) 02/13/2022 0000   GLUCOSE 85 04/16/2021 0829   BUN 7 02/13/2022 0000   CREATININE 0.8 02/13/2022 0000   CREATININE 1.00 04/16/2021 0829   CALCIUM 9.6 02/13/2022 0000   PROT 6.8 04/16/2021 0829   ALBUMIN 4.0 02/13/2022 0000   AST 24 02/13/2022 0000   AST 9 (L) 04/16/2021 0829   ALT 15 02/13/2022 0000   ALT 14 04/16/2021 0829   ALKPHOS 84 02/13/2022 0000   BILITOT 0.3 04/16/2021 0829   GFRNONAA >60 04/16/2021 8938  Lab Results  Component Value Date   WBC 4.2 02/13/2022   NEUTROABS 1.64 02/13/2022   HGB 13.8 02/13/2022   HCT 41 02/13/2022   MCV 98 (A) 12/05/2021   PLT 247 02/13/2022   Imaging:  [facility] Clinician Interpretation: I have personally reviewed the CNS images as listed.  My interpretation, in the context of the patient's clinical presentation, is progressive disease  No results found.   Assessment/Plan Frontal glioblastoma ([place]) [C71.1]  Leonard [name] is again clinically and radiographically progressive today, now having completed 8 weeks course of irinotecan and avastin.  MRI brain demonstrates continues progression of enhancing tumor volume along corpus callosum and left frontal lobe.    We extensively reviewed goals of care today.  Consideration was given to further salvage chemotherapy, hospice.    Patient  would like referral to hospice at this time rather than proceed with next line of treatment.    Should continue Keppra 1551m BID, Depakote 5027mBID.  KeDaron [name] may return to clinic as needed.  We are happy to be available to the hospice team in whatever way is needed.  All questions were answered. The patient knows to call the clinic with any problems, questions or concerns. No barriers to learning were detected.  The total time spent in the encounter was 40 minutes and more than 50% was on counseling and review of test results   ZaVentura SellersMD Medical Director of Neuro-Oncology [facility]7/17/23 2:22 PM

## 2022-02-18 NOTE — Telephone Encounter (Signed)
-----   Message from Georgette Shell, RN sent at 02/18/2022  3:03 PM EDT ----- Regarding: FW: Hospice  ----- Message ----- From: Emmit Alexanders, RN Sent: 02/18/2022   2:58 PM EDT To: Georgette Shell, RN Subject: FW: Hospice                                     ----- Message ----- From: [name], MD Sent: 02/18/2022   2:34 PM EDT To: Juanetta Beets, [place]; [name], NP; # Subject: Hospice                                        Pls refer [facility] for progressive glioblastoma of brain. He has been through surgery, radiation and multiple chemo meds. He lives with his brother, will prob need to communicate with him as patient fairly disabled Hospice [phone]

## 2022-02-19 ENCOUNTER — Telehealth: Payer: Self-pay

## 2022-02-19 NOTE — Telephone Encounter (Signed)
Message sent to Dr Hinton Rao and [facility] @ 1126 regarding pt refusal of Hospice @ this time.

## 2022-02-20 ENCOUNTER — Other Ambulatory Visit: Payer: Self-pay | Admitting: Oncology

## 2022-02-20 DIAGNOSIS — C711 Malignant neoplasm of frontal lobe: Secondary | ICD-10-CM

## 2022-02-20 NOTE — Telephone Encounter (Addendum)
I spoke with Charles,pt's brother. We scheduled f/u with Dr Hinton Rao for 03/08/2022. He understands to call us sooner if needed.  LVM for Ebony Hail making her aware of Dr Remi Deter response below. I asked her to retrurn my call if needed. ----- Message from [name], MD sent at 02/20/2022  7:15 AM EDT ----- Regarding: RE: Hospice Contact: [phone] I had discussed with them last week, trying to prepare them for his discussion. Then he discussed at appt on Monday.   I guess we will have to wait until they are ready.  They may want to sched f/u appt in 2-3 weeks with labs to give him supportive care and discuss further. They can call us as needed ----- Message ----- From: Dairl Ponder, RN Sent: 02/19/2022  11:26 AM EDT To: [name], MD; [name], NP Subject: Hospice                                        Received call from [facility], intake coordinator @ [place]. She states that Dr Mickeal Skinner made a referral to them for pt. When they called to schedule an appt, pt's brother, Juanda Crumble, was unaware of the referral. Ebony Hail was told that he isn't really ready for Hospice. Pt does live with his brother, Juanda Crumble.

## 2022-02-22 ENCOUNTER — Ambulatory Visit: Payer: Medicaid Other

## 2022-03-03 ENCOUNTER — Other Ambulatory Visit: Payer: Self-pay | Admitting: Internal Medicine

## 2022-03-03 DIAGNOSIS — R569 Unspecified convulsions: Secondary | ICD-10-CM

## 2022-03-08 ENCOUNTER — Inpatient Hospital Stay: Payer: Medicaid Other | Attending: Internal Medicine | Admitting: Oncology

## 2022-03-08 ENCOUNTER — Encounter: Payer: Self-pay | Admitting: Oncology

## 2022-03-08 ENCOUNTER — Other Ambulatory Visit: Payer: Self-pay | Admitting: Oncology

## 2022-03-08 ENCOUNTER — Inpatient Hospital Stay: Payer: Medicaid Other

## 2022-03-08 VITALS — BP 115/67 | HR 87 | Temp 97.6°F | Resp 20 | Ht 76.0 in | Wt 160.1 lb

## 2022-03-08 DIAGNOSIS — C711 Malignant neoplasm of frontal lobe: Secondary | ICD-10-CM

## 2022-03-08 LAB — BASIC METABOLIC PANEL
BUN: 11 (ref 4–21)
CO2: 22 (ref 13–22)
Chloride: 106 (ref 99–108)
Creatinine: 0.7 (ref 0.6–1.3)
Glucose: 90
Potassium: 4 mEq/L (ref 3.5–5.1)
Sodium: 137 (ref 137–147)

## 2022-03-08 LAB — CBC AND DIFFERENTIAL
HCT: 39 — AB (ref 41–53)
Hemoglobin: 12.9 — AB (ref 13.5–17.5)
Neutrophils Absolute: 4.45
Platelets: 336 10*3/uL (ref 150–400)
WBC: 7.8

## 2022-03-08 LAB — COMPREHENSIVE METABOLIC PANEL
Albumin: 3.9 (ref 3.5–5.0)
Calcium: 10 (ref 8.7–10.7)

## 2022-03-08 LAB — HEPATIC FUNCTION PANEL
ALT: 24 U/L (ref 10–40)
AST: 41 — AB (ref 14–40)
Alkaline Phosphatase: 71 (ref 25–125)
Bilirubin, Total: 0.5

## 2022-03-08 LAB — CBC: RBC: 4.26 (ref 3.87–5.11)

## 2022-03-08 LAB — MAGNESIUM: Magnesium: 2.1

## 2022-03-08 NOTE — Progress Notes (Signed)
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  We know he had two surgeries in November, the first to attempt biopsy and the second for resection.  He started radiation with concurrent temozolomide on December 2nd, and completed 15 fractions of radiation therapy on December 21st.  He had rapid recurrence with a 2.9 cm lesion in the left posterior frontal lobe. He has been on bevacizumab every 2 weeks and tolerating well. He then had continued progression as noted on MRI of the brain. Lomustine was added to his bevacizumab per Leonard [name]. He took his first dose in early March without difficulty. He took his second dose of lomustine in mid April and seems to have tolerated it well with some increasing fatigue. Unfortunately, MRI revealed continued progression and he was started on Irinotecan with bevacizumab per Leonard [name] on May 18. He was experiencing severe nausea and weight loss, and scans show progression of disease, so treatment was stopped. He is now on supportive care. I brought up hospice again but they are against that at this time.       He is feeling a little better now that he is off chemotherapy. We will continue supportive/palliative care. He will return in 1 month with CBC and CMP. The patient and his family understand the plans discussed today and are in agreement with them.  He knows to contact our office if he develops concerns prior to his next appointment.    Leonard [name]  Leonard [name] AT [name] [address] [place] [address] Dept: [phone] Dept Fax: [phone]   Orders Placed This  Encounter  Procedures   CBC and differential    This external order was created through the Results Console.   CBC    This external order was created through the Results Console.   Basic metabolic panel    This external order was created through the Results Console.   Comprehensive metabolic panel    This external order was created through the Results Console.   Hepatic function panel    This external order was created through the Results Console.   Magnesium    This order was created through External Result Entry      CHIEF COMPLAINT:  CC: A 56 year old male with history of glioblastoma here for 2 week evaluation  Current Treatment:  Irinotecan/ Bevacizumab  INTERVAL HISTORY:  Leonard [name] is here today for repeat clinical assessment. He is now off chemotherapy due to progression of disease and they are still trying to process this. They ask me what else can be done. I pointed out that he is feeling better off treatment. He is "walking better" and eating some food. He complains of swelling and paralysis of the right arm and pain of the hand. He says his abdomen 'feels funniy' at times.  He denies fevers or chills. He denies pain. His appetite is good. His weight has decreased 5 pounds over last 2 weeks .His family is here with him today.  I have reviewed the past medical history, past surgical history, social history and family history with the patient and they are unchanged from previous note.  ALLERGIES:  has  No Known Allergies.  MEDICATIONS:  Current Outpatient Medications  Medication Sig Dispense Refill   albuterol (VENTOLIN HFA) 108 (90 Base) MCG/ACT inhaler Inhale 2 puffs into the lungs every 6 (six) hours as needed for wheezing or shortness of breath. 8 g 2   divalproex (DEPAKOTE) 500 MG DR tablet Take 1 tablet by mouth twice daily 60 tablet 0   furosemide (LASIX) 20 MG tablet Take 1 tablet by mouth once daily 5 tablet 0   levETIRAcetam (KEPPRA) 750 MG tablet Take 2 tablets  (1,500 mg total) by mouth 2 (two) times daily. 120 tablet 2   magic mouthwash w/lidocaine SOLN Take 5 mLs by mouth 4 (four) times daily as needed for mouth pain. 240 mL 3   ondansetron (ZOFRAN) 4 MG tablet Take 1 tablet (4 mg total) by mouth every 4 (four) hours as needed for nausea. 90 tablet 3   potassium chloride SA (KLOR-CON M) 20 MEQ tablet Take 1 tablet (20 mEq total) by mouth 2 (two) times daily. 60 tablet 5   prochlorperazine (COMPAZINE) 10 MG tablet Take 1 tablet (10 mg total) by mouth every 6 (six) hours as needed for nausea or vomiting. 30 tablet 1   No current facility-administered medications for this visit.    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[place])   06/15/2020 Cancer Staging   Staging form: Brain and Spinal Cord, AJCC 8th Edition - Pathologic stage from 06/15/2020: WHO Grade IV - Signed by Leonard [name] on 09/30/2020 Histopathologic type: Glioblastoma Stage prefix: Initial diagnosis Histologic grading system: 4 grade system Extent of surgical resection: Complete (gross) resection Solitary (s) or multifocal (m) tumors in the primary site: Solitary Seizures at presentation: Present Duration of symptoms before diagnosis: Short   09/29/2020 - 09/29/2020 Chemotherapy   Patient is on Treatment Plan : BRAIN GLIOBLASTOMA Consolidation Temozolomide Days 1-5 q28 Days      08/03/2021 - 12/07/2021 Chemotherapy   Patient is on Treatment Plan : BRAIN GBM Bevacizumab 14d      12/20/2021 - 02/01/2022 Chemotherapy   Patient is on Treatment Plan : BRAIN GBM [facility] Irinotecan + Bevacizumab D1,15 q 28d         REVIEW OF SYSTEMS:   Constitutional: Denies fevers, chills or abnormal weight loss Eyes: Denies blurriness of vision Ears, nose, mouth, throat, and face: Denies mucositis or sore throat Respiratory: Denies cough, dyspnea or wheezes Cardiovascular: Denies  palpitation, chest discomfort or lower extremity swelling Gastrointestinal:  Denies nausea, heartburn or change in bowel habits Skin: Denies abnormal skin rashes Lymphatics: Denies new lymphadenopathy or easy bruising Neurological:Denies new weaknesses Behavioral/Psych: Mood is stable, no new changes  All other systems were reviewed with the patient and are negative.   VITALS:  Blood pressure 115/67, pulse 87, temperature 97.6 F (36.4 C), temperature source Oral, resp. rate 20, height '6\' 4"'$  (1.93 m), weight 160 lb 1.6 oz (72.6 kg), SpO2 97 %.  Wt Readings from Last 3 Encounters:  03/08/22 160 lb 1.6 oz (72.6 kg)  02/18/22 165 lb 6.4 oz (75 kg)  02/13/22 166 lb (75.3 kg)    Body mass index is 19.49 kg/m.  Performance status (ECOG): 1 - Symptomatic but completely ambulatory  PHYSICAL EXAM:   GENERAL:alert, no distress and comfortable SKIN: skin color, texture, turgor are normal, no rashes or significant lesions EYES: normal, Conjunctiva are pink and non-injected, sclera clear OROPHARYNX:no exudate, no erythema and  lips, buccal mucosa, and tongue normal  NECK: supple, thyroid normal size, non-tender, without nodularity LYMPH:  no palpable lymphadenopathy in the cervical, axillary or inguinal LUNGS: clear to auscultation and percussion with normal breathing effort HEART: regular rate & rhythm and no murmurs and no lower extremity edema ABDOMEN:abdomen soft, non-tender and normal bowel sounds Musculoskeletal:no cyanosis of digits and no clubbing  NEURO: alert & oriented x 3 with fluent  He does have paralysis and swelling of the right upper extremity and weakness of the right leg  LABORATORY DATA:  I have reviewed the data as listed    Component Value Date/Time   NA 137 03/08/2022 0000   K 4.0 03/08/2022 0000   CL 106 03/08/2022 0000   CO2 22 03/08/2022 0000   GLUCOSE 85 04/16/2021 0829   BUN 11 03/08/2022 0000   CREATININE 0.7 03/08/2022 0000   CREATININE 1.00 04/16/2021  0829   CALCIUM 10.0 03/08/2022 0000   PROT 6.8 04/16/2021 0829   ALBUMIN 3.9 03/08/2022 0000   AST 41 (A) 03/08/2022 0000   AST 9 (L) 04/16/2021 0829   ALT 24 03/08/2022 0000   ALT 14 04/16/2021 0829   ALKPHOS 71 03/08/2022 0000   BILITOT 0.3 04/16/2021 0829   GFRNONAA >60 04/16/2021 0829    No results found for: "SPEP", "UPEP"  Lab Results  Component Value Date   WBC 7.8 03/08/2022   NEUTROABS 4.45 03/08/2022   HGB 12.9 (A) 03/08/2022   HCT 39 (A) 03/08/2022   MCV 98 (A) 12/05/2021   PLT 336 03/08/2022      Chemistry      Component Value Date/Time   NA 137 03/08/2022 0000   K 4.0 03/08/2022 0000   CL 106 03/08/2022 0000   CO2 22 03/08/2022 0000   BUN 11 03/08/2022 0000   CREATININE 0.7 03/08/2022 0000   CREATININE 1.00 04/16/2021 0829   GLU 90 03/08/2022 0000      Component Value Date/Time   CALCIUM 10.0 03/08/2022 0000   ALKPHOS 71 03/08/2022 0000   AST 41 (A) 03/08/2022 0000   AST 9 (L) 04/16/2021 0829   ALT 24 03/08/2022 0000   ALT 14 04/16/2021 0829   BILITOT 0.3 04/16/2021 0829       RADIOGRAPHIC STUDIES: I have personally reviewed the radiological images as listed and agreed with the findings in the report. No results found.

## 2022-03-12 ENCOUNTER — Telehealth: Payer: Self-pay | Admitting: Dietician

## 2022-03-12 NOTE — Telephone Encounter (Signed)
Patient screened on MST. Third attempt to reach. Patient had been referred to hospice.  However, recent notes suggest patient not et ready for hospice.  Final attempt to reach patient by telephone. Have left messages with return number. Please consult RD for future needs.   April Manson, RDN, LDN Registered Dietitian, [place] Part Time Remote (Usual office hours: Tuesday-Thursday) Cell: [phone]

## 2022-03-16 NOTE — Progress Notes (Signed)
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 He had two surgeries in November, the first to attempt biopsy and the second for resection.  He started radiation with concurrent temozolomide on December 2nd, and completed 15 fractions of radiation therapy on December 21st.  He had rapid recurrence with a 2.9 cm lesion in the left posterior frontal lobe. He has been on bevacizumab every 2 weeks and tolerating well. He then had continued progression as noted on MRI of the brain. Lomustine was added to his bevacizumab per Dr. Mickeal Skinner. He took his first dose in early March without difficulty. He took his second dose of lomustine in mid April and seems to have tolerated it well with some increasing fatigue. Unfortunately, MRI revealed continued progression and he was started on Irinotecan with bevacizumab per Dr. Mickeal Skinner. He is not tolerating this well with nausea, vomiting, diarrhea and severe weight loss. I will hold his dose today.  Nausea and vomiting He has had more nausea and vomiting with this last treatment. He is currently only able to keep down water, some fluids and applesauce. We discussed taking his nausea medicine 30 minutes prior to eating and also alternating his zofran and compazine throughout the day. I also provided him with Ensure shakes today. He states he does like these, but if he drinks too many he gets diarrhea. We discussed the use of imodium should that occur. He has lost another 10 pounds in 2 weeks.  Hypokalemia This is due to his severe nausea, vomiting and diarrhea. I will place him on 20 meq BID but offered to give him IV supplement.   He is no longer able  to eat and is steadily losing weight. I don't see any neurologic improvement but he says the jerking of his legs has stopped. He is scheduled for an MRI this weekend and then appointment with Dr. Mickeal Skinner on Monday 7/17. I will hold this week's dose of irinotecan until we see those results.  I have tried to prepare him for the fact that there is not much more that can be done and he needs to consider goals of care and comfort measures such as hospice. I will place him on potassium supplement orally if tolerated.His brother is with him as usual. The patient understands the plans discussed today and is in agreement with them.  He knows to contact our office if he develops concerns prior to his next appointment.      [name], MD  [facility] AT [facility] [address] [place] [address] Dept: [phone] Dept Fax: [phone]   Orders Placed This Encounter  Procedures   CBC and differential    This external order was created through the Results Console.   CBC    This external order was created through the Results Console.   Basic metabolic panel    This external order was created through the Results Console.   Comprehensive metabolic panel    This external order was created through the Results Console.   Hepatic function panel    This external order was created through the Results Console.      CHIEF COMPLAINT:  CC: A 56-year  old male with history of glioblastoma here for 2 week evaluation  Current Treatment:  Irinotecan/ Bevacizumab  INTERVAL HISTORY:  Leonard [name] is here today for repeat clinical assessment.He has severe nausea, vomiting and diarrhea, with steady weight loss, another 10 pounds in the last 2 weeks and now his potassium is down to 3.0 with a sodium of 131. The rest of his labs are unremarkable. He is unsteady on his feet and having trouble getting words out. He does say that his legs are not jerking anymore  since he has been on the irinotecan. He is unable to eat solid foods but does take liquids. He likes Ensure but gets diarrhea if he drinks more than one daily. I will place him on oral potassium supplement if he can tolerate.  I recommended IV but he declines.  I told him I recommend we hold his dose this week and he will get his MRI this weekend.  He will follow up with Dr. Mickeal Skinner on Monday 7/17.  I have tried to prepare him for bad news.  He denies fevers or chills. He denies pain. His appetite is good. His weight has decreased 10 pounds over last 2 weeks .  I have reviewed the past medical history, past surgical history, social history and family history with the patient and they are unchanged from previous note.  ALLERGIES:  has No Known Allergies.  MEDICATIONS:  Current Outpatient Medications  Medication Sig Dispense Refill   albuterol (VENTOLIN HFA) 108 (90 Base) MCG/ACT inhaler Inhale 2 puffs into the lungs every 6 (six) hours as needed for wheezing or shortness of breath. 8 g 2   divalproex (DEPAKOTE) 500 MG DR tablet Take 1 tablet by mouth twice daily 60 tablet 0   furosemide (LASIX) 20 MG tablet Take 1 tablet by mouth once daily 5 tablet 0   levETIRAcetam (KEPPRA) 750 MG tablet Take 2 tablets (1,500 mg total) by mouth 2 (two) times daily. 120 tablet 2   magic mouthwash w/lidocaine SOLN Take 5 mLs by mouth 4 (four) times daily as needed for mouth pain. 240 mL 3   ondansetron (ZOFRAN) 4 MG tablet Take 1 tablet (4 mg total) by mouth every 4 (four) hours as needed for nausea. 90 tablet 3   potassium chloride SA (KLOR-CON M) 20 MEQ tablet Take 1 tablet (20 mEq total) by mouth 2 (two) times daily. 60 tablet 5   prochlorperazine (COMPAZINE) 10 MG tablet Take 1 tablet (10 mg total) by mouth every 6 (six) hours as needed for nausea or vomiting. 30 tablet 1   No current facility-administered medications for this visit.    HISTORY OF PRESENT ILLNESS:   Oncology History  Frontal glioblastoma  ([place])  06/15/2020 Initial Diagnosis   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[place])   06/15/2020 Cancer Staging   Staging form: Brain and Spinal Cord, AJCC 8th Edition - Pathologic stage from 06/15/2020: WHO Grade IV - Signed by [name], MD on 09/30/2020 Histopathologic type: Glioblastoma Stage prefix: Initial diagnosis Histologic grading system: 4 grade system Extent of surgical resection: Complete (gross) resection Solitary (s) or multifocal (m) tumors in the primary site: Solitary Seizures at presentation: Present Duration of symptoms before diagnosis: Short   09/29/2020 - 09/29/2020 Chemotherapy   Patient is on Treatment Plan : BRAIN GLIOBLASTOMA Consolidation Temozolomide Days 1-5 q28 Days      08/03/2021 - 12/07/2021 Chemotherapy   Patient is on Treatment Plan : BRAIN GBM Bevacizumab 14d      12/20/2021 -  02/01/2022 Chemotherapy   Patient is on Treatment Plan : BRAIN GBM [facility] Irinotecan + Bevacizumab D1,15 q 28d         REVIEW OF SYSTEMS:   Constitutional: Denies fevers, chills or abnormal weight loss Eyes: Denies blurriness of vision Ears, nose, mouth, throat, and face: Denies mucositis or sore throat Respiratory: Denies cough, dyspnea or wheezes Cardiovascular: Denies palpitation, chest discomfort or lower extremity swelling Gastrointestinal:  Denies nausea, heartburn or change in bowel habits Skin: Denies abnormal skin rashes Lymphatics: Denies new lymphadenopathy or easy bruising Neurological:Denies numbness, tingling or new weaknesses Behavioral/Psych: Mood is stable, no new changes  All other systems were reviewed with the patient and are negative.   VITALS:  Blood pressure 106/65, pulse (!) 108, temperature (!) 97.4 F (36.3 C), temperature source Oral, resp. rate 20, height '6\' 4"'$  (1.93 m), weight 166 lb (75.3 kg), SpO2 97 %.  Wt Readings from Last 3 Encounters:  03/08/22 160 lb 1.6 oz (72.6 kg)  02/18/22 165 lb 6.4 oz (75 kg)  02/13/22 166 lb (75.3 kg)     Body mass index is 20.21 kg/m.  Performance status (ECOG): 1 - Symptomatic but completely ambulatory  PHYSICAL EXAM:   GENERAL:alert, no distress and comfortable SKIN: skin color, texture, turgor are normal, no rashes or significant lesions EYES: normal, Conjunctiva are pink and non-injected, sclera clear OROPHARYNX:no exudate, no erythema and lips, buccal mucosa, and tongue normal  NECK: supple, thyroid normal size, non-tender, without nodularity LYMPH:  no palpable lymphadenopathy in the cervical, axillary or inguinal LUNGS: clear to auscultation and percussion with normal breathing effort HEART: regular rate & rhythm and no murmurs and no lower extremity edema ABDOMEN:abdomen soft, non-tender and normal bowel sounds Musculoskeletal:no cyanosis of digits and no clubbing  NEURO: alert & oriented x 3 with fluent speech, no focal motor/sensory deficits  LABORATORY DATA:  I have reviewed the data as listed    Component Value Date/Time   NA 137 03/08/2022 0000   K 4.0 03/08/2022 0000   CL 106 03/08/2022 0000   CO2 22 03/08/2022 0000   GLUCOSE 85 04/16/2021 0829   BUN 11 03/08/2022 0000   CREATININE 0.7 03/08/2022 0000   CREATININE 1.00 04/16/2021 0829   CALCIUM 10.0 03/08/2022 0000   PROT 6.8 04/16/2021 0829   ALBUMIN 3.9 03/08/2022 0000   AST 41 (A) 03/08/2022 0000   AST 9 (L) 04/16/2021 0829   ALT 24 03/08/2022 0000   ALT 14 04/16/2021 0829   ALKPHOS 71 03/08/2022 0000   BILITOT 0.3 04/16/2021 0829   GFRNONAA >60 04/16/2021 0829    No results found for: "SPEP", "UPEP"  Lab Results  Component Value Date   WBC 7.8 03/08/2022   NEUTROABS 4.45 03/08/2022   HGB 12.9 (A) 03/08/2022   HCT 39 (A) 03/08/2022   MCV 98 (A) 12/05/2021   PLT 336 03/08/2022      Chemistry      Component Value Date/Time   NA 137 03/08/2022 0000   K 4.0 03/08/2022 0000   CL 106 03/08/2022 0000   CO2 22 03/08/2022 0000   BUN 11 03/08/2022 0000   CREATININE 0.7 03/08/2022 0000    CREATININE 1.00 04/16/2021 0829   GLU 90 03/08/2022 0000      Component Value Date/Time   CALCIUM 10.0 03/08/2022 0000   ALKPHOS 71 03/08/2022 0000   AST 41 (A) 03/08/2022 0000   AST 9 (L) 04/16/2021 0829   ALT 24 03/08/2022 0000   ALT 14 04/16/2021 0829  BILITOT 0.3 04/16/2021 0829       RADIOGRAPHIC STUDIES: I have personally reviewed the radiological images as listed and agreed with the findings in the report. MR BRAIN W WO CONTRAST  Result Date: 02/18/2022 CLINICAL DATA:  Frontal glioblastoma, assess treatment response EXAM: MRI HEAD WITHOUT AND WITH CONTRAST TECHNIQUE: Multiplanar, multiecho pulse sequences of the brain and surrounding structures were obtained without and with intravenous contrast. CONTRAST:  8m GADAVIST GADOBUTROL 1 MMOL/ML IV SOLN COMPARISON:  12/07/2021 FINDINGS: Brain: Known glioblastoma centered in the left para median frontal parietal region with progression along the inferior projections of the mass, especially at the level of the corpus callosum body where transverse span 2.6 cm today and previously 1.9 cm. A lateral projection along the left corona radiata is also progressed in size. The more superior aspect of the mass shows progressive heterogeneity and necrosis. No evidence of CSF spread. No herniation, infarct, or acute hemorrhage. Vascular: Major flow voids and vascular enhancements are preserved Skull and upper cervical spine: Unremarkable left-sided craniotomy Sinuses/Orbits: Negative IMPRESSION: Left cerebral glioblastoma with progressive tumor along the corpus callosum and left corona radiata. The bulk of the mass shows progressive necrosis. Electronically Signed   By: JJorje GuildM.D.   On: 02/18/2022 14:42

## 2022-04-05 DEATH — deceased

## 2022-08-13 ENCOUNTER — Other Ambulatory Visit (HOSPITAL_COMMUNITY): Payer: Self-pay
